# Patient Record
Sex: Male | Born: 1955 | Race: White | Hispanic: No | Marital: Single | Smoking: Never smoker
Health system: Southern US, Community
[De-identification: ages and names within clinical notes are randomized; demographics above are authoritative.]

## PROBLEM LIST (undated history)

## (undated) DIAGNOSIS — F0390 Unspecified dementia without behavioral disturbance: Secondary | ICD-10-CM

## (undated) DIAGNOSIS — E119 Type 2 diabetes mellitus without complications: Secondary | ICD-10-CM

## (undated) DIAGNOSIS — I10 Essential (primary) hypertension: Secondary | ICD-10-CM

---

## 2020-04-29 ENCOUNTER — Emergency Department: Payer: Medicare Other

## 2020-04-29 ENCOUNTER — Observation Stay
Admission: EM | Admit: 2020-04-29 | Discharge: 2020-04-30 | Disposition: A | Discharge status: Home / Self Care | Payer: Medicare Other | Attending: Hospitalist | Admitting: Hospitalist

## 2020-04-29 ENCOUNTER — Other Ambulatory Visit: Payer: Self-pay

## 2020-04-29 DIAGNOSIS — F0390 Unspecified dementia without behavioral disturbance: Secondary | ICD-10-CM | POA: Diagnosis present

## 2020-04-29 DIAGNOSIS — E119 Type 2 diabetes mellitus without complications: Secondary | ICD-10-CM | POA: Diagnosis not present

## 2020-04-29 DIAGNOSIS — I10 Essential (primary) hypertension: Secondary | ICD-10-CM | POA: Insufficient documentation

## 2020-04-29 DIAGNOSIS — R4182 Altered mental status, unspecified: Secondary | ICD-10-CM | POA: Insufficient documentation

## 2020-04-29 DIAGNOSIS — R42 Dizziness and giddiness: Secondary | ICD-10-CM | POA: Diagnosis present

## 2020-04-29 DIAGNOSIS — I160 Hypertensive urgency: Secondary | ICD-10-CM

## 2020-04-29 DIAGNOSIS — Z20822 Contact with and (suspected) exposure to covid-19: Secondary | ICD-10-CM | POA: Diagnosis not present

## 2020-04-29 DIAGNOSIS — T8501XA Breakdown (mechanical) of ventricular intracranial (communicating) shunt, initial encounter: Principal | ICD-10-CM | POA: Insufficient documentation

## 2020-04-29 DIAGNOSIS — T85618A Breakdown (mechanical) of other specified internal prosthetic devices, implants and grafts, initial encounter: Secondary | ICD-10-CM

## 2020-04-29 HISTORY — DX: Unspecified dementia, unspecified severity, without behavioral disturbance, psychotic disturbance, mood disturbance, and anxiety: F03.90

## 2020-04-29 HISTORY — DX: Type 2 diabetes mellitus without complications: E11.9

## 2020-04-29 LAB — CBC WITH DIFFERENTIAL/PLATELET
Abs Immature Granulocytes: 0.02 10*3/uL (ref 0.00–0.07)
Basophils Absolute: 0 10*3/uL (ref 0.0–0.1)
Basophils Relative: 1 %
Eosinophils Absolute: 0.2 10*3/uL (ref 0.0–0.5)
Eosinophils Relative: 3 %
HCT: 39 % (ref 39.0–52.0)
Hemoglobin: 13.3 g/dL (ref 13.0–17.0)
Immature Granulocytes: 0 %
Lymphocytes Relative: 16 %
Lymphs Abs: 1.4 10*3/uL (ref 0.7–4.0)
MCH: 30.7 pg (ref 26.0–34.0)
MCHC: 34.1 g/dL (ref 30.0–36.0)
MCV: 90.1 fL (ref 80.0–100.0)
Monocytes Absolute: 1 10*3/uL (ref 0.1–1.0)
Monocytes Relative: 11 %
Neutro Abs: 6.1 10*3/uL (ref 1.7–7.7)
Neutrophils Relative %: 69 %
Platelets: 233 10*3/uL (ref 150–400)
RBC: 4.33 MIL/uL (ref 4.22–5.81)
RDW: 14.2 % (ref 11.5–15.5)
WBC: 8.8 10*3/uL (ref 4.0–10.5)
nRBC: 0 % (ref 0.0–0.2)

## 2020-04-29 LAB — URINALYSIS, COMPLETE (UACMP) WITH MICROSCOPIC
Bacteria, UA: NONE SEEN
Bilirubin Urine: NEGATIVE
Glucose, UA: NEGATIVE mg/dL
Hgb urine dipstick: NEGATIVE
Ketones, ur: NEGATIVE mg/dL
Leukocytes,Ua: NEGATIVE
Nitrite: NEGATIVE
Protein, ur: NEGATIVE mg/dL
Specific Gravity, Urine: 1.012 (ref 1.005–1.030)
Squamous Epithelial / HPF: NONE SEEN (ref 0–5)
pH: 8 (ref 5.0–8.0)

## 2020-04-29 LAB — ETHANOL: Alcohol, Ethyl (B): <10 mg/dL (ref <10)

## 2020-04-29 LAB — COMPREHENSIVE METABOLIC PANEL
ALT: 22 U/L (ref 0–44)
AST: 17 U/L (ref 15–41)
Albumin: 3.9 g/dL (ref 3.5–5.0)
Alkaline Phosphatase: 90 U/L (ref 38–126)
Anion gap: 8 (ref 5–15)
BUN: 12 mg/dL (ref 8–23)
CO2: 27 mmol/L (ref 22–32)
Calcium: 10 mg/dL (ref 8.9–10.3)
Chloride: 105 mmol/L (ref 98–111)
Creatinine, Ser: 0.88 mg/dL (ref 0.61–1.24)
GFR, Estimated: >60 mL/min (ref >60)
Glucose, Bld: 127 mg/dL — ABNORMAL HIGH (ref 70–99)
Potassium: 3.4 mmol/L — ABNORMAL LOW (ref 3.5–5.1)
Sodium: 140 mmol/L (ref 135–145)
Total Bilirubin: 1.1 mg/dL (ref 0.3–1.2)
Total Protein: 6.9 g/dL (ref 6.5–8.1)

## 2020-04-29 LAB — RESP PANEL BY RT-PCR (FLU A&B, COVID) ARPGX2
Influenza A by PCR: NEGATIVE
Influenza B by PCR: NEGATIVE
SARS Coronavirus 2 by RT PCR: NEGATIVE

## 2020-04-29 LAB — TROPONIN I (HIGH SENSITIVITY): Troponin I (High Sensitivity): 8 ng/L (ref <18)

## 2020-04-29 MED ORDER — SODIUM CHLORIDE 0.9 % IV SOLN: 250.0000 mL | INTRAVENOUS | Status: DC | PRN

## 2020-04-29 MED ORDER — TRAMADOL HCL 50 MG PO TABS
50.0000 mg | ORAL_TABLET | Freq: Four times a day (QID) | ORAL | Status: DC | PRN
  Administered 2020-04-29 – 2020-04-30 (×2): 50 mg via ORAL
  Filled 2020-04-29 (×2): qty 1

## 2020-04-29 MED ORDER — LOSARTAN POTASSIUM 50 MG PO TABS
100.0000 mg | ORAL_TABLET | Freq: Every day | ORAL | Status: DC
  Administered 2020-04-29 – 2020-04-30 (×2): 100 mg via ORAL
  Filled 2020-04-29 (×2): qty 2

## 2020-04-29 MED ORDER — ACETAMINOPHEN 325 MG PO TABS: 650.0000 mg | ORAL_TABLET | Freq: Four times a day (QID) | ORAL | Status: DC | PRN

## 2020-04-29 MED ORDER — DULOXETINE HCL 30 MG PO CPEP
60.0000 mg | ORAL_CAPSULE | Freq: Every day | ORAL | Status: DC
  Administered 2020-04-30: 60 mg via ORAL
  Filled 2020-04-29: qty 2

## 2020-04-29 MED ORDER — ONDANSETRON HCL 4 MG/2ML IJ SOLN: 4.0000 mg | Freq: Four times a day (QID) | INTRAMUSCULAR | Status: DC | PRN

## 2020-04-29 MED ORDER — ONDANSETRON HCL 4 MG PO TABS: 4.0000 mg | ORAL_TABLET | Freq: Four times a day (QID) | ORAL | Status: DC | PRN

## 2020-04-29 MED ORDER — TRAZODONE HCL 50 MG PO TABS
50.0000 mg | ORAL_TABLET | Freq: Every day | ORAL | Status: DC
  Administered 2020-04-30: 50 mg via ORAL
  Filled 2020-04-29: qty 1

## 2020-04-29 MED ORDER — METOPROLOL TARTRATE 50 MG PO TABS
50.0000 mg | ORAL_TABLET | Freq: Two times a day (BID) | ORAL | Status: DC
  Administered 2020-04-30 (×2): 50 mg via ORAL
  Filled 2020-04-29 (×2): qty 1

## 2020-04-29 MED ORDER — FAMOTIDINE 20 MG PO TABS
20.0000 mg | ORAL_TABLET | Freq: Two times a day (BID) | ORAL | Status: DC
  Administered 2020-04-30 (×2): 20 mg via ORAL
  Filled 2020-04-29 (×2): qty 1

## 2020-04-29 MED ORDER — ASPIRIN EC 81 MG PO TBEC
81.0000 mg | DELAYED_RELEASE_TABLET | Freq: Every day | ORAL | Status: DC
  Administered 2020-04-29 – 2020-04-30 (×2): 81 mg via ORAL
  Filled 2020-04-29 (×2): qty 1

## 2020-04-29 MED ORDER — ACETAMINOPHEN 650 MG RE SUPP: 650.0000 mg | Freq: Four times a day (QID) | RECTAL | Status: DC | PRN

## 2020-04-29 MED ORDER — QUETIAPINE FUMARATE 300 MG PO TABS
300.0000 mg | ORAL_TABLET | Freq: Every day | ORAL | Status: DC
  Administered 2020-04-30: 300 mg via ORAL
  Filled 2020-04-29 (×2): qty 1

## 2020-04-29 MED ORDER — ALUM & MAG HYDROXIDE-SIMETH 200-200-20 MG/5ML PO SUSP
30.0000 mL | Freq: Once | ORAL | Status: AC
  Administered 2020-04-29: 30 mL via ORAL
  Filled 2020-04-29: qty 30

## 2020-04-29 MED ORDER — SODIUM CHLORIDE 0.9% FLUSH: 3.0000 mL | INTRAVENOUS | Status: DC | PRN

## 2020-04-29 MED ORDER — SODIUM CHLORIDE 0.9% FLUSH
3.0000 mL | Freq: Two times a day (BID) | INTRAVENOUS | Status: DC
  Administered 2020-04-30 (×2): 3 mL via INTRAVENOUS

## 2020-04-29 MED ORDER — ATORVASTATIN CALCIUM 20 MG PO TABS
80.0000 mg | ORAL_TABLET | Freq: Every day | ORAL | Status: DC
  Administered 2020-04-30: 80 mg via ORAL
  Filled 2020-04-29: qty 4

## 2020-04-29 NOTE — H&P (Signed)
History and Physical    Michael Alvarado EXB:[id number] DOB: 1956-01-03 DOA: 04/29/2020  PCP: Pcp, No   Patient coming from: Home  I have personally briefly reviewed patient's old medical records in [facility]  Chief Complaint: Dizziness                               Headache  Most of the history is obtained from ER notes as patient is a very poor historian  HPI: Michael Alvarado is a 64 y.o. 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  Patient was noted to be markedly hypertensive at the urgent care center so EMS was called to bring patient to the ER.  Patient recently moved to the area from [facility] which is where he had his VP shunt placed.  He moved to [facility] to be with family after the hospital in [place] was unable to place him in a skilled living facility and is currently living temporarily with his mother who is unable to care for him.   He comes in today with complaints of dizziness and a headache which he said has been going on since after the VP shunt was placed.  He denies having any chest pain, no shortness of breath, no palpitations, no diaphoresis, no abdominal pain, no nausea, no vomiting, no changes in his bowel habits, no urinary symptoms, no fever, no chills or cough. Labs show sodium 140, potassium 3.4, chloride 105, bicarb 27, glucose 127, BUN 12, creatinine 0.8, calcium 10, alkaline phosphatase 90, albumin 3.9, AST 17, ALT 22, troponin VIII, white count 8.8, hemoglobin 13.3, hematocrit 39, MCV 90.1, RDW 14.2, platelet count 233 Urinalysis is sterile CT scan of the head without contrast shows no acute intracranial findings.  Right parietal ventriculostomy catheter crosses the midline and terminates in the vicinity of  the margin of the left periventricular white matter.  The mild ventriculomegaly is probably ex vacuo given the lesser degree of prominence of the temporal horns.  Chronic ischemic microvascular white matter disease. Abdominal x-ray shows right posterior approach ventriculoperitoneal shunt with proximal tip overlying the left cerebral hemisphere and distal tip terminating in the right lower abdomen. VP shunt programmed at a pressure setting a little above 146 mm H2O. No kink or discontinuity of the shunt along its course. Please see separately dictated CT head 04/29/2020 for further details. No acute cardiopulmonary abnormality. Nonobstructive bowel gas pattern. Chest x-ray reviewed by me shows no acute abnormality Right hip x-ray shows no acute abnormality Twelve-lead EKG shows sinus rhythm   ED Course: Patient is a 64 year old Caucasian male who was sent from the urgent care center for evaluation of hypertension.  Patient was recently discharged to the care of his sister from the hospital in [facility] because they were unable to place him in a SNF. He moved to [place] and is staying temporarily with his mother.  In the ER he complained of dizziness and a headache as well as right hip pain and had a CT scan of the head which shows a VP shunt.  Patient is oriented only to person and place but not to time and is unable to provide most of the history and the ER provider wanted the patient admitted to  get an MRI of his brain to rule out a stroke.  It is unclear if the VP shunt is MRI compatible and we will wait records from his sister prior to obtaining the MRI.  He will be admitted to the hospital for evaluation   Review of Systems: As per HPI otherwise 10 point review of systems negative.    Past Medical History:  Diagnosis Date  . Dementia (HCC)   . Diabetes mellitus without complication [facility](HCC)     Past Surgical History:  Procedure Laterality Date  . BACK SURGERY       reports that he  has never smoked. He does not have any smokeless tobacco history on file. He reports current alcohol use. He reports previous drug use.  Not on File  Family History  Problem Relation Age of Onset  . Other Mother   . Hypertension Mother      Prior to Admission medications   Not on File    Physical Exam: Vitals:   04/29/20 1430 04/29/20 1515 04/29/20 1630 04/29/20 1654  BP: (!) 172/86  (!) 168/89 (!) 178/111  Pulse: 68 65 68 77  Resp: (!) 22  18 18   Temp:    98 F (36.7 C)  TempSrc:    Oral  SpO2: 100% 100% 100% 96%  Weight:      Height:         Vitals:   04/29/20 1430 04/29/20 1515 04/29/20 1630 04/29/20 1654  BP: (!) 172/86  (!) 168/89 (!) 178/111  Pulse: 68 65 68 77  Resp: (!) 22  18 18   Temp:    98 F (36.7 C)  TempSrc:    Oral  SpO2: 100% 100% 100% 96%  Weight:      Height:        Constitutional: NAD, alert and oriented x 2.  Person and place Eyes: PERRL, lids and conjunctivae normal ENMT: Mucous membranes are moist.  Neck: normal, supple, no masses, no thyromegaly Respiratory: clear to auscultation bilaterally, no wheezing, no crackles. Normal respiratory effort. No accessory muscle use.  Cardiovascular: Regular rate and rhythm, no murmurs / rubs / gallops. No extremity edema. 2+ pedal pulses. No carotid bruits.  Abdomen: no tenderness, no masses palpated. No hepatosplenomegaly. Bowel sounds positive.  Musculoskeletal: no clubbing / cyanosis. No joint deformity upper and lower extremities.  Skin: no rashes, lesions, ulcers.  Scar from recent laminectomy Neurologic: No gross focal neurologic deficit.  Unsteady gait Psychiatric: Normal mood and affect.   Labs on Admission: I have personally reviewed following labs and imaging studies  CBC: Recent Labs  Lab 04/29/20 1328  WBC 8.8  NEUTROABS 6.1  HGB 13.3  HCT 39.0  MCV 90.1  PLT 233   Basic Metabolic Panel: Recent Labs  Lab 04/29/20 1328  NA 140  K 3.4*  CL 105  CO2 27  GLUCOSE 127*  BUN  12  CREATININE 0.88  CALCIUM 10.0   GFR: Estimated Creatinine Clearance: 110.3 mL/min (by C-G formula based on SCr of 0.88 mg/dL). Liver Function Tests: Recent Labs  Lab 04/29/20 1328  AST 17  ALT 22  ALKPHOS 90  BILITOT 1.1  PROT 6.9  ALBUMIN 3.9   No results for input(s): LIPASE, AMYLASE in the last 168 hours. No results for input(s): AMMONIA in the last 168 hours. Coagulation Profile: No results for input(s): INR, PROTIME in the last 168 hours. Cardiac Enzymes: No results for input(s): CKTOTAL, CKMB, CKMBINDEX, TROPONINI in the last 168 hours. BNP (last 3  results) No results for input(s): PROBNP in the last 8760 hours. HbA1C: No results for input(s): HGBA1C in the last 72 hours. CBG: No results for input(s): GLUCAP in the last 168 hours. Lipid Profile: No results for input(s): CHOL, HDL, LDLCALC, TRIG, CHOLHDL, LDLDIRECT in the last 72 hours. Thyroid Function Tests: No results for input(s): TSH, T4TOTAL, FREET4, T3FREE, THYROIDAB in the last 72 hours. Anemia Panel: No results for input(s): VITAMINB12, FOLATE, FERRITIN, TIBC, IRON, RETICCTPCT in the last 72 hours. Urine analysis:    Component Value Date/Time   COLORURINE YELLOW (A) 04/29/2020 1328   APPEARANCEUR HAZY (A) 04/29/2020 1328   LABSPEC 1.012 04/29/2020 1328   PHURINE 8.0 04/29/2020 1328   GLUCOSEU NEGATIVE 04/29/2020 1328   HGBUR NEGATIVE 04/29/2020 1328   BILIRUBINUR NEGATIVE 04/29/2020 1328   KETONESUR NEGATIVE 04/29/2020 1328   PROTEINUR NEGATIVE 04/29/2020 1328   NITRITE NEGATIVE 04/29/2020 1328   LEUKOCYTESUR NEGATIVE 04/29/2020 1328    Radiological Exams on Admission: DG Skull 1-3 Views  Result Date: 04/29/2020 CLINICAL DATA:  Evaluate shunt malfunction.  History of diabetes. EXAM: SKULL - 1-3 VIEW; CHEST 1 VIEW; DG CERVICAL SPINE - 1 VIEW; ABDOMEN - 1 VIEW COMPARISON:  CT head 04/29/2020. FINDINGS: Right posterior approach ventriculoperitoneal shunt with tip terminating over the left cerebral  hemisphere. Programmable ventriculoperitoneal shunt appears to be a Codman Certas valve with a pressure setting a little above 146 mm H2O. The shunt is noted to course along the right neck, right chest, and terminate within the right right lower mid abdomen. No suggestion of kinking or discontinuity of the shunt. Well-aerated sinuses on x-ray skull view. The heart size and mediastinal contours are within normal limits. No focal consolidation. No pulmonary edema. No pleural effusion. No pneumothorax. No acute osseous abnormality. Nonobstructive bowel gas pattern on the abdomen x-ray. No acute osseous abnormality of the visualized osseous structures. Surgical hardware at the L4-L5 level. IMPRESSION: 1. Right posterior approach ventriculoperitoneal shunt with proximal tip overlying the left cerebral hemisphere and distal tip terminating in the right lower abdomen. VP shunt programmed at a pressure setting a little above 146 mm H2O. No kink or discontinuity of the shunt along its course. Please see separately dictated CT head 04/29/2020 for further details. 2. No acute cardiopulmonary abnormality. 3. Nonobstructive bowel gas pattern. Electronically Signed   By: Tish Frederickson M.D.   On: 04/29/2020 15:28   DG Chest 1 View  Result Date: 04/29/2020 CLINICAL DATA:  Evaluate shunt malfunction.  History of diabetes. EXAM: SKULL - 1-3 VIEW; CHEST 1 VIEW; DG CERVICAL SPINE - 1 VIEW; ABDOMEN - 1 VIEW COMPARISON:  CT head 04/29/2020. FINDINGS: Right posterior approach ventriculoperitoneal shunt with tip terminating over the left cerebral hemisphere. Programmable ventriculoperitoneal shunt appears to be a Codman Certas valve with a pressure setting a little above 146 mm H2O. The shunt is noted to course along the right neck, right chest, and terminate within the right right lower mid abdomen. No suggestion of kinking or discontinuity of the shunt. Well-aerated sinuses on x-ray skull view. The heart size and mediastinal  contours are within normal limits. No focal consolidation. No pulmonary edema. No pleural effusion. No pneumothorax. No acute osseous abnormality. Nonobstructive bowel gas pattern on the abdomen x-ray. No acute osseous abnormality of the visualized osseous structures. Surgical hardware at the L4-L5 level. IMPRESSION: 1. Right posterior approach ventriculoperitoneal shunt with proximal tip overlying the left cerebral hemisphere and distal tip terminating in the right lower abdomen. VP shunt programmed at a pressure  setting a little above 146 mm H2O. No kink or discontinuity of the shunt along its course. Please see separately dictated CT head 04/29/2020 for further details. 2. No acute cardiopulmonary abnormality. 3. Nonobstructive bowel gas pattern. Electronically Signed   By: Tish Frederickson M.D.   On: 04/29/2020 15:28   DG Cervical Spine 1 View  Result Date: 04/29/2020 CLINICAL DATA:  Evaluate shunt malfunction.  History of diabetes. EXAM: SKULL - 1-3 VIEW; CHEST 1 VIEW; DG CERVICAL SPINE - 1 VIEW; ABDOMEN - 1 VIEW COMPARISON:  CT head 04/29/2020. FINDINGS: Right posterior approach ventriculoperitoneal shunt with tip terminating over the left cerebral hemisphere. Programmable ventriculoperitoneal shunt appears to be a Codman Certas valve with a pressure setting a little above 146 mm H2O. The shunt is noted to course along the right neck, right chest, and terminate within the right right lower mid abdomen. No suggestion of kinking or discontinuity of the shunt. Well-aerated sinuses on x-ray skull view. The heart size and mediastinal contours are within normal limits. No focal consolidation. No pulmonary edema. No pleural effusion. No pneumothorax. No acute osseous abnormality. Nonobstructive bowel gas pattern on the abdomen x-ray. No acute osseous abnormality of the visualized osseous structures. Surgical hardware at the L4-L5 level. IMPRESSION: 1. Right posterior approach ventriculoperitoneal shunt with  proximal tip overlying the left cerebral hemisphere and distal tip terminating in the right lower abdomen. VP shunt programmed at a pressure setting a little above 146 mm H2O. No kink or discontinuity of the shunt along its course. Please see separately dictated CT head 04/29/2020 for further details. 2. No acute cardiopulmonary abnormality. 3. Nonobstructive bowel gas pattern. Electronically Signed   By: Tish Frederickson M.D.   On: 04/29/2020 15:28   DG Abd 1 View  Result Date: 04/29/2020 CLINICAL DATA:  Evaluate shunt malfunction.  History of diabetes. EXAM: SKULL - 1-3 VIEW; CHEST 1 VIEW; DG CERVICAL SPINE - 1 VIEW; ABDOMEN - 1 VIEW COMPARISON:  CT head 04/29/2020. FINDINGS: Right posterior approach ventriculoperitoneal shunt with tip terminating over the left cerebral hemisphere. Programmable ventriculoperitoneal shunt appears to be a Codman Certas valve with a pressure setting a little above 146 mm H2O. The shunt is noted to course along the right neck, right chest, and terminate within the right right lower mid abdomen. No suggestion of kinking or discontinuity of the shunt. Well-aerated sinuses on x-ray skull view. The heart size and mediastinal contours are within normal limits. No focal consolidation. No pulmonary edema. No pleural effusion. No pneumothorax. No acute osseous abnormality. Nonobstructive bowel gas pattern on the abdomen x-ray. No acute osseous abnormality of the visualized osseous structures. Surgical hardware at the L4-L5 level. IMPRESSION: 1. Right posterior approach ventriculoperitoneal shunt with proximal tip overlying the left cerebral hemisphere and distal tip terminating in the right lower abdomen. VP shunt programmed at a pressure setting a little above 146 mm H2O. No kink or discontinuity of the shunt along its course. Please see separately dictated CT head 04/29/2020 for further details. 2. No acute cardiopulmonary abnormality. 3. Nonobstructive bowel gas pattern. Electronically  Signed   By: Tish Frederickson M.D.   On: 04/29/2020 15:28   CT Head Wo Contrast  Result Date: 04/29/2020 CLINICAL DATA:  Confusion.  Acute neurologic deficit. EXAM: CT HEAD WITHOUT CONTRAST TECHNIQUE: Contiguous axial images were obtained from the base of the skull through the vertex without intravenous contrast. COMPARISON:  None. FINDINGS: Brain: Right parietal ventriculostomy catheter crosses the midline and terminates in the vicinity of the margin of  the left periventricular white matter. The mild ventriculomegaly is probably ex vacuo given the lesser degree of prominence of the temporal horns. 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 Otherwise, the brainstem, cerebellum, cerebral peduncles, thalamus, basal ganglia, basilar cisterns, and ventricular system appear within normal limits. No intracranial hemorrhage, mass lesion, or acute CVA. Vascular: There is atherosclerotic calcification of the cavernous carotid arteries bilaterally. Skull: Unremarkable Sinuses/Orbits: Unremarkable Other: Unremarkable IMPRESSION: 1. No acute intracranial findings. 2. Right parietal ventriculostomy catheter crosses the midline and terminates in the vicinity of the margin of the left periventricular white matter. The mild ventriculomegaly is probably ex vacuo given the lesser degree of prominence of the temporal horns. 3. 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 Electronically Signed   By: Gaylyn Rong M.D.   On: 04/29/2020 14:05   DG Hip Unilat W or Wo Pelvis 2-3 Views Right  Result Date: 04/29/2020 CLINICAL DATA:  Pain in the right hip post fall. EXAM: DG HIP (WITH OR WITHOUT PELVIS) 2-3V RIGHT COMPARISON:  None. FINDINGS: There is no evidence of hip fracture or dislocation. There is no evidence of arthropathy or other focal bone abnormality. Lower  lumbosacral spine fusion. IMPRESSION: Negative. Electronically Signed   By: Ted Mcalpine M.D.   On: 04/29/2020 15:24    EKG: Independently reviewed.  Sinus rhythm  Assessment/Plan Active Problems:   AMS (altered mental status)   Dementia (HCC)   Diabetes mellitus without complication (HCC)   Hypertensive urgency     Altered mental status  Patient is awake, alert and oriented only to person and place Patient states that he was recently diagnosed with dementia but she does not know what his baseline is ??  Concern for possible acute stroke Patient will need an MRI of the brain for further evaluation but has a VP shunt.  Awaiting records from his sister to see if VP shunt is MRI compatible Continue aspirin and statins We will request PT evaluation   Dementia Continue Seroquel, trazodone and Cymbalta   History of normal pressures hydrocephalus status post VP shunt creation The patient was placed in March 2021    Diabetes mellitus Maintain consistent carbohydrate diet Blood sugar checks with meals   Hypertensive urgency Probably related to medication noncompliance Continue metoprolol and Cozaar      DVT prophylaxis: SCD Code Status: Full code Family Communication: Greater than 50% of time was spent discussing patient's condition and plan of care with his sister Zada Girt over the phone.  All questions and concerns have been addressed.  She verbalizes understanding and agrees with the plan. Disposition Plan: Back to previous home environment Consults called: PT    Keirstan Iannello MD [facility]     04/29/2020, 5:13 PM

## 2020-04-29 NOTE — ED Notes (Signed)
Per EDP, number listed as patient's phone number actually belongs to his sister. EDP reports that per sister, pt was homeless in [facility] and his doctor sent him up here to be with his family. Sister now reports that she is unable to care for him at home.   EDP states pt will be either admitted medically or stay as a SW workup.

## 2020-04-29 NOTE — Progress Notes (Signed)
Medication Reconciliation Report  For Home History Technicians  HIGHLIGHTS:  1. The patient WAS personally interviewed 2. If not, what was the main source used: PHARMACY RECORDS 3. Does the patient appear to take any anti-coagulation agents (e.g. warfarin, Eliquis or Xarelto): NO 4. Does the patient appear to take any anti-convulsant agents (e.g. divalproex, levetiracetam or phenytoin): NO 5. Does the patient appear to use any insulin products (e.g. Lantus, Novolin or Humalog): NO 6. Does the patient appear to take any "beta-blockers" (e.g. metoprolol, carvedilol or bisoprolol: YES  BARRIERS:  1. Were there any barriers that prevented or complicated the medication reconciliation process: YES 2. If yes, what was the primary barrier encountered: Other 3. Does the patient appear compliant with prescribed medications: NO 4. Does the patient express any barriers with compliance: YES 5. What is the primary barrier the patient reports: Other non-compliance   NOTES:[Include any concerns, remarks or complaints the patient expresses regarding medication therapy. Any observations or other information that might be useful to the treatment team can also be included. Immediate needs or concerns should be referred to the RN or appropriate member of the treatment team.]  [employer]  ** The above is intended solely for informational and/or communicative purposes. It should in no way be considered an endorsement of any specific treatment, therapy or action. **

## 2020-04-29 NOTE — ED Notes (Signed)
Pt reports that [facility] he had the shunt placed

## 2020-04-29 NOTE — ED Notes (Signed)
Pt reports that Bonita Quin, his sister who dropped him off at [facility], went to [employer]. He does not know her phone number, or the number of anyone else we could contact on his behalf. Pt lives at home by himself.   Pt gives verbal permission to update Bonita Quin on care and let her in for visitation if she arrives.   Pt unable to confirm home address. Pt does have some dizziness at baseline. Pt states he had a "stent place in my head" back in March (frequent falls and dizziness), closed incision noted on URQ abdomen from drain placement. 8/10 headache.  Pt denies chest pain, SOB, n/v/d, abd pain, vision changes.

## 2020-04-29 NOTE — ED Triage Notes (Signed)
Pt arrives from [facility] pain via [facility]. Hx dizziness, but less than normal. back surgery done in march. Pt sister dropped him off, but was not present for EMS.   EMS reports mild confusion by pt, though alert and oriented x4  Pt on arrival denies cp, negative stroke screen. Pt denies being on blood thinners, does report BP meds (does not know which).   212/110 99% 84 hr

## 2020-04-29 NOTE — ED Notes (Signed)
EDP states he wants an MRI done to confirm shunt functionality. Unknown at this time if shunt is MRI compatible, pt sister is on her way and will hopefully be able to assist in identification.   Per EDP, if shunt is not compatible it will need to be turned off for the MRI

## 2020-04-29 NOTE — ED Notes (Signed)
UA sent to lab with save label

## 2020-04-29 NOTE — ED Notes (Signed)
Pt reports heartburn, per EDP once xrays clear orders will be placed. Pt notified

## 2020-04-29 NOTE — ED Notes (Signed)
Received callback @1734  from pt sister ). Information obtained recorded below.  Sister believes shunt was placed March 21st. She is able to confirm from billing documents only that is a ventriculoperitoneal shunt, also listed as brain fluid drainage shunt, but she was unable to locate an exact device name or model. She reports that she is having difficulty accessing his online medical record at this time.   She reports that Dr. March 23 and a Dr. Dennis Bast were listed several times on the documents, but is unable to verify if they are the ones who performed the procedure.   She believes the procedure took place at either [facility] or [facility].   She reported that his case worker in [facility] was [place]. She does not have her phone number, but will try to retrieve it tomorrow. She did provide an email address, which is  [email or web address]  For medical history, she knows that he "had a stroke 15-20 years ago" and "has a history of some heart issues," but is not able to elaborate further.   This RN gave her the ED fax number per the secretary, and Cala Bradford stated that she would send over a medication list and relevant medical documents if she found anything else out.   Nothing received at the time of writing

## 2020-04-29 NOTE — ED Provider Notes (Signed)
[facility] Emergency Department Provider Note   ____________________________________________   First MD Initiated Contact with Patient 04/29/20 1328     (approximate)  I have reviewed the triage vital signs and the nursing notes.   HISTORY  Chief Complaint Hypertension    HPI Michael Alvarado is a 64 y.o. male with past medical history of hypertension, diabetes, and VP shunt who presents to the ED for hypertension and dizziness.  History is limited due to patient's confusion and possible history of dementia.  Per EMS, patient was dropped off at urgent care by his sister due to concern for right hip pain and dizziness.  Patient was noted to be markedly hypertensive at urgent care and so EMS was called to bring patient to the ED.  Patient currently complains of feeling dizzy, states this has been the case ever since he got to surgery in March.  He states he had back surgery as well as a "stent" to his brain, is now currently visiting family here in [place].  Speaking with his sister, patient was previously homeless in the [facility] area, where he had his VP shunt placed.  During his most recent hospital admission, plan was made for patient to be discharged to family here at [facility].  Sister now states they are unable to care for the patient and he has been increasingly confused and dizzy lately.  He also complained of right hip pain, and so she brought him to urgent care earlier today.        Past Medical History:  Diagnosis Date  . Diabetes mellitus without complication [facility])     Patient Active Problem List   Diagnosis Date Noted  . AMS (altered mental status) 04/29/2020    History reviewed. No pertinent surgical history.  Prior to Admission medications   Not on File    Allergies Patient has no allergy information on record.  History reviewed. No pertinent family history.  Social History Social History   Tobacco Use  . Smoking status: Not  on file  Substance Use Topics  . Alcohol use: Not on file  . Drug use: Not on file    Review of Systems  Constitutional: No fever/chills Eyes: No visual changes. ENT: No sore throat. Cardiovascular: Denies chest pain. Respiratory: Denies shortness of breath. Gastrointestinal: No abdominal pain.  No nausea, no vomiting.  No diarrhea.  No constipation. Genitourinary: Negative for dysuria. Musculoskeletal: Negative for back pain.  Positive for right hip pain. Skin: Negative for rash. Neurological: Negative for headaches, focal weakness or numbness.  Positive for dizziness.  ____________________________________________   PHYSICAL EXAM:  VITAL SIGNS: ED Triage Vitals  Enc Vitals Group     BP 04/29/20 1323 (!) 194/106     Pulse Rate 04/29/20 1323 71     Resp 04/29/20 1323 16     Temp --      Temp src --      SpO2 04/29/20 1323 100 %     Weight 04/29/20 1324 271 lb 2.7 oz (123 kg)     Height 04/29/20 1324 5\' 11"  (1.803 m)     Head Circumference --      Peak Flow --      Pain Score 04/29/20 1324 8     Pain Loc --      Pain Edu? --      Excl. in GC? --     Constitutional: Alert and oriented to person and place, but not time. Eyes: Conjunctivae are normal.  Pupils equal round and reactive to light bilaterally. Head: Atraumatic. Nose: No congestion/rhinnorhea. Mouth/Throat: Mucous membranes are moist. Neck: Normal ROM Cardiovascular: Normal rate, regular rhythm. Grossly normal heart sounds. Respiratory: Normal respiratory effort.  No retractions. Lungs CTAB. Gastrointestinal: Soft and nontender. No distention. Genitourinary: deferred Musculoskeletal: No lower extremity tenderness nor edema. Neurologic:  Normal speech and language. No gross focal neurologic deficits are appreciated.  Ambulates with unsteady gait. Skin:  Skin is warm, dry and intact. No rash noted. Psychiatric: Mood and affect are normal. Speech and behavior are  normal.  ____________________________________________   LABS (all labs ordered are listed, but only abnormal results are displayed)  Labs Reviewed  COMPREHENSIVE METABOLIC PANEL - Abnormal; Notable for the following components:      Result Value   Potassium 3.4 (*)    Glucose, Bld 127 (*)    All other components within normal limits  URINALYSIS, COMPLETE (UACMP) WITH MICROSCOPIC - Abnormal; Notable for the following components:   Color, Urine YELLOW (*)    APPearance HAZY (*)    All other components within normal limits  RESP PANEL BY RT-PCR (FLU A&B, COVID) ARPGX2  CBC WITH DIFFERENTIAL/PLATELET  ETHANOL  TROPONIN I (HIGH SENSITIVITY)   ____________________________________________  EKG  ED ECG REPORT I, Chesley Noon, the attending physician, personally viewed and interpreted this ECG.   Date: 04/29/2020  EKG Time: 13:24  Rate: 69  Rhythm: normal sinus rhythm  Axis: Normal  Intervals:none  ST&T Change: None   PROCEDURES  Procedure(s) performed (including Critical Care):  Procedures   ____________________________________________   INITIAL IMPRESSION / ASSESSMENT AND PLAN / ED COURSE       64 year old male with past medical history of hypertension, diabetes, and VP shunt who presents to the ED due to right hip pain along with increasing confusion and dizziness.  Patient disoriented to time and walks with an unsteady gait, but otherwise has no focal neurologic deficits on exam.  No apparent injuries to his right hip, chest x-ray reviewed by me and is negative for fracture or other acute process.  He is neurovascularly intact to his bilateral lower extremities.  CT head performed and shows distal tip of VP shunt to be in the periventricular white matter, no obstructions noted on shunt series.  Case discussed with Dr. 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  Currently no  concerns whatsoever for infected shunt.  Remainder of lab work is unremarkable.  UA shows no evidence of infection.  His baseline mental status is unclear, but given concerns for increasing confusion as well as unsteady gait, case discussed with hospitalist for admission.  He would benefit from MRI to further rule out stroke, although we will need to determine whether his shunt valve is MRI compatible.  I have asked his sister to assist with this and also help obtaining his usual medication list.      ____________________________________________   FINAL CLINICAL IMPRESSION(S) / ED DIAGNOSES  Final diagnoses:  Shunt malfunction  Altered mental status, unspecified altered mental status type     ED Discharge Orders    None       Note:  This document was prepared using Dragon voice recognition software and may include unintentional dictation errors.   Chesley Noon, MD 04/29/20 [phone]

## 2020-04-29 NOTE — ED Notes (Signed)
Pt given phone to call Bonita Quin, number dialed with no response. Pt says he left a message and hung up. Unsure if that is actually what occurred  Pt denies that he is homeless in [facility], states he was living with his mother, Lucendia Herrlich. Pt does affirm that his doctor recommended he come up here for the holidays to stay with Bonita Quin

## 2020-04-29 NOTE — ED Notes (Signed)
Pt states [name] is his sister, would like [facility] called to see if she came back. Will address at earliest opportunity   Pt reports that he had surgery on his lumbar spine around the same time the shunt was placed. Pt reports pain to sometimes both hips, but was being seen at [facility] for pain to the left hip. Pt reports pain "comes and goes."  Attempted to ambulate pt per MD. Pt able to stand and walk without assistance, though does state that he gets mildly dizzy and parts of the room sometimes spin. Pt somewhat confused during assessment.   Pt states that the surgeries were done in [facility], where he lives, but he is here visiting his sister for the holidays. Pt had planned to stay through christmas, states that his doctor had told him to follow up with [facility] while he was here.

## 2020-04-29 NOTE — ED Notes (Signed)
Pt given phone and [facility] number to call and inquire about his sister

## 2020-04-30 ENCOUNTER — Encounter: Payer: Self-pay | Admitting: Internal Medicine

## 2020-04-30 DIAGNOSIS — I160 Hypertensive urgency: Secondary | ICD-10-CM | POA: Diagnosis not present

## 2020-04-30 DIAGNOSIS — T8501XA Breakdown (mechanical) of ventricular intracranial (communicating) shunt, initial encounter: Secondary | ICD-10-CM | POA: Diagnosis not present

## 2020-04-30 LAB — CBC
HCT: 36.9 % — ABNORMAL LOW (ref 39.0–52.0)
Hemoglobin: 12.6 g/dL — ABNORMAL LOW (ref 13.0–17.0)
MCH: 30.9 pg (ref 26.0–34.0)
MCHC: 34.1 g/dL (ref 30.0–36.0)
MCV: 90.4 fL (ref 80.0–100.0)
Platelets: 211 10*3/uL (ref 150–400)
RBC: 4.08 MIL/uL — ABNORMAL LOW (ref 4.22–5.81)
RDW: 14.3 % (ref 11.5–15.5)
WBC: 6.6 10*3/uL (ref 4.0–10.5)
nRBC: 0 % (ref 0.0–0.2)

## 2020-04-30 LAB — BASIC METABOLIC PANEL
Anion gap: 9 (ref 5–15)
BUN: 13 mg/dL (ref 8–23)
CO2: 24 mmol/L (ref 22–32)
Calcium: 9.7 mg/dL (ref 8.9–10.3)
Chloride: 106 mmol/L (ref 98–111)
Creatinine, Ser: 0.83 mg/dL (ref 0.61–1.24)
GFR, Estimated: >60 mL/min (ref >60)
Glucose, Bld: 111 mg/dL — ABNORMAL HIGH (ref 70–99)
Potassium: 3.5 mmol/L (ref 3.5–5.1)
Sodium: 139 mmol/L (ref 135–145)

## 2020-04-30 LAB — HIV ANTIBODY (ROUTINE TESTING W REFLEX): HIV Screen 4th Generation wRfx: NONREACTIVE

## 2020-04-30 MED ORDER — METOPROLOL TARTRATE 50 MG PO TABS: 50.0000 mg | ORAL_TABLET | Freq: Two times a day (BID) | ORAL | 2 refills | Status: DC

## 2020-04-30 MED ORDER — BUTALBITAL-APAP-CAFFEINE 50-325-40 MG PO TABS: 1.0000 | ORAL_TABLET | Freq: Two times a day (BID) | ORAL | 0 refills | Status: AC | PRN

## 2020-04-30 MED ORDER — AMLODIPINE BESYLATE 10 MG PO TABS: 10.0000 mg | ORAL_TABLET | Freq: Every day | ORAL | 2 refills | Status: DC

## 2020-04-30 MED ORDER — PNEUMOCOCCAL VAC POLYVALENT 25 MCG/0.5ML IJ INJ: 0.5000 mL | INJECTION | Freq: Once | INTRAMUSCULAR | 0 refills | Status: AC

## 2020-04-30 MED ORDER — BUTALBITAL-APAP-CAFFEINE 50-325-40 MG PO TABS
1.0000 | ORAL_TABLET | Freq: Once | ORAL | Status: AC
  Administered 2020-04-30: 13:00:00 1 via ORAL
  Filled 2020-04-30: qty 1

## 2020-04-30 MED ORDER — LOSARTAN POTASSIUM 100 MG PO TABS: 100.0000 mg | ORAL_TABLET | Freq: Every day | ORAL | 2 refills | Status: DC

## 2020-04-30 MED ORDER — PNEUMOCOCCAL VAC POLYVALENT 25 MCG/0.5ML IJ INJ: 0.5000 mL | INJECTION | INTRAMUSCULAR | Status: DC

## 2020-04-30 MED ORDER — AMLODIPINE BESYLATE 10 MG PO TABS
10.0000 mg | ORAL_TABLET | Freq: Every day | ORAL | Status: DC
  Administered 2020-04-30: 13:00:00 10 mg via ORAL
  Filled 2020-04-30: qty 1

## 2020-04-30 NOTE — Plan of Care (Signed)
DISCHARGE NOTE HOME Lyall Faciane to be discharged home per MD order. Discussed prescriptions and follow up appointments with the patient. Prescriptions given to patient; medication list explained in detail. Patient verbalized understanding.  Skin clean, dry and intact without evidence of skin break down, no evidence of skin tears noted. IV catheter discontinued intact. Site without signs and symptoms of complications. Dressing and pressure applied. Pt denies pain at the site currently. No complaints noted.  Patient free of lines, drains, and wounds.   An After Visit Summary (AVS) was printed and given to the patient. Patient escorted via wheelchair, and discharged home via private auto.  Arlice Colt, RN

## 2020-04-30 NOTE — Consult Note (Signed)
Neurosurgery-New Consultation Evaluation 04/30/2020 Michael Alvarado [id number]  Identifying Statement: Michael Alvarado is a 64 y.o. male from [place] [address] with prior VP shunt placement  Physician Requesting Consultation: [facility] ED  History of Present Illness: Mr Michael Alvarado had a VP shunt placed in March of this year for symptoms he describes as difficulty walking with headaches and dizziness.  He does state they did a lumbar puncture at that time and he felt he did have some improvement.  After the shunt was placed, he does feel his symptoms did improve but more recently is dealing with more difficulty with headaches and dizziness.  He states his gait remains improved but still not completely normal.  He currently is complaining of severe headache.  He denies any problems with the shunt placement and recovery from the surgery.   Past Medical History:  Past Medical History:  Diagnosis Date   Dementia (HCC)    Diabetes mellitus without complication (HCC)     Social History: Social History   Socioeconomic History   Marital status: Single    Spouse name: Not on file   Number of children: Not on file   Years of education: Not on file   Highest education level: Not on file  Occupational History   Not on file  Tobacco Use   Smoking status: Never Smoker   Smokeless tobacco: Never Used  Substance and Sexual Activity   Alcohol use: Yes    Comment: occasional   Drug use: Not Currently   Sexual activity: Not on file  Other Topics Concern   Not on file  Social History Narrative   Not on file   Social Determinants of Health   Financial Resource Strain:    Difficulty of Paying Living Expenses: Not on file  Food Insecurity:    Worried About Running Out of Food in the Last Year: Not on file   Ran Out of Food in the Last Year: Not on file  Transportation Needs:    Lack of Transportation (Medical): Not on file   Lack of Transportation (Non-Medical): Not on file  Physical  Activity:    Days of Exercise per Week: Not on file   Minutes of Exercise per Session: Not on file  Stress:    Feeling of Stress : Not on file  Social Connections:    Frequency of Communication with Friends and Family: Not on file   Frequency of Social Gatherings with Friends and Family: Not on file   Attends Religious Services: Not on file   Active Member of Clubs or Organizations: Not on file   Attends Banker Meetings: Not on file   Marital Status: Not on file  Intimate Partner Violence:    Fear of Current or Ex-Partner: Not on file   Emotionally Abused: Not on file   Physically Abused: Not on file   Sexually Abused: Not on file    Family History: Family History  Problem Relation Age of Onset   Other Mother    Hypertension Mother     Review of Systems:  Review of Systems - General ROS: Positive for dizziness Psychological ROS: Negative Ophthalmic ROS: Negative ENT ROS: Negative Hematological and Lymphatic ROS: Negative  Endocrine ROS: Negative Respiratory ROS: Negative Cardiovascular ROS: Negative Gastrointestinal ROS: Negative Genito-Urinary ROS: Negative Musculoskeletal ROS: Negative Neurological ROS: Positive for headaches, gait difficulty Dermatological ROS: Negative  Physical Exam: BP (!) 180/72 (BP Location: Right Arm)   Pulse 82   Temp 98.3 F (36.8 C)  Resp 17   Ht 5\' 11"  (1.803 m)   Wt 98.8 kg   SpO2 98%   BMI 30.38 kg/m  Body mass index is 30.38 kg/m. Body surface area is 2.22 meters squared. General appearance: Alert, cooperative, in no acute distress Head: Normocephalic, healed incision in right parietal area Eyes: Normal, EOM intact Oropharynx: Moist without lesions Ext: No edema in LE bilaterally  Neurologic exam:  Mental status: alertness: alert, orientation: Oriented to person and city, did not know month affect: normal Speech: fluent and clear, naming and repetition intact Cranial nerves:  II: Visual fields are full by  confrontation with exception of some slight decrease in the right lower quadrant,  No ptosis III/IV/VI: extra-ocular motions intact bilaterally V/VII:no evidence of facial droop or weakness  VIII: hearing normal XI: trapezius strength symmetric,  sternocleidomastoid strength symmetric XII: tongue strength symmetric  Motor:strength symmetric 5/5, normal muscle mass and tone in all extremities with slight right pronator drift Sensory: intact to light touch in all extremities Gait: not tested  Laboratory: Results for orders placed or performed during the hospital encounter of 04/29/20  Resp Panel by RT-PCR (Flu A&B, Covid) Nasopharyngeal Swab   Specimen: Nasopharyngeal Swab; Nasopharyngeal(NP) swabs in vial transport medium  Result Value Ref Range   SARS Coronavirus 2 by RT PCR NEGATIVE NEGATIVE   Influenza A by PCR NEGATIVE NEGATIVE   Influenza B by PCR NEGATIVE NEGATIVE  CBC with Differential  Result Value Ref Range   WBC 8.8 4.0 - 10.5 K/uL   RBC 4.33 4.22 - 5.81 MIL/uL   Hemoglobin 13.3 13.0 - 17.0 g/dL   HCT 05/01/20 39 - 52 %   MCV 90.1 80.0 - 100.0 fL   MCH 30.7 26.0 - 34.0 pg   MCHC 34.1 30.0 - 36.0 g/dL   RDW 30.0 76.2 - 26.3 %   Platelets 233 150 - 400 K/uL   nRBC 0.0 0.0 - 0.2 %   Neutrophils Relative % 69 %   Neutro Abs 6.1 1.7 - 7.7 K/uL   Lymphocytes Relative 16 %   Lymphs Abs 1.4 0.7 - 4.0 K/uL   Monocytes Relative 11 %   Monocytes Absolute 1.0 0.1 - 1.0 K/uL   Eosinophils Relative 3 %   Eosinophils Absolute 0.2 0.0 - 0.5 K/uL   Basophils Relative 1 %   Basophils Absolute 0.0 0.0 - 0.1 K/uL   Immature Granulocytes 0 %   Abs Immature Granulocytes 0.02 0.00 - 0.07 K/uL  Comprehensive metabolic panel  Result Value Ref Range   Sodium 140 135 - 145 mmol/L   Potassium 3.4 (L) 3.5 - 5.1 mmol/L   Chloride 105 98 - 111 mmol/L   CO2 27 22 - 32 mmol/L   Glucose, Bld 127 (H) 70 - 99 mg/dL   BUN 12 8 - 23 mg/dL   Creatinine, Ser 33.5 0.61 - 1.24 mg/dL   Calcium 4.56 8.9  - 25.6 mg/dL   Total Protein 6.9 6.5 - 8.1 g/dL   Albumin 3.9 3.5 - 5.0 g/dL   AST 17 15 - 41 U/L   ALT 22 0 - 44 U/L   Alkaline Phosphatase 90 38 - 126 U/L   Total Bilirubin 1.1 0.3 - 1.2 mg/dL   GFR, Estimated 38.9 >37 mL/min   Anion gap 8 5 - 15  Ethanol  Result Value Ref Range   Alcohol, Ethyl (B) <10 <10 mg/dL  Urinalysis, Complete w Microscopic  Result Value Ref Range   Color, Urine YELLOW (A) YELLOW  APPearance HAZY (A) CLEAR   Specific Gravity, Urine 1.012 1.005 - 1.030   pH 8.0 5.0 - 8.0   Glucose, UA NEGATIVE NEGATIVE mg/dL   Hgb urine dipstick NEGATIVE NEGATIVE   Bilirubin Urine NEGATIVE NEGATIVE   Ketones, ur NEGATIVE NEGATIVE mg/dL   Protein, ur NEGATIVE NEGATIVE mg/dL   Nitrite NEGATIVE NEGATIVE   Leukocytes,Ua NEGATIVE NEGATIVE   RBC / HPF 0-5 0 - 5 RBC/hpf   WBC, UA 0-5 0 - 5 WBC/hpf   Bacteria, UA NONE SEEN NONE SEEN   Squamous Epithelial / LPF NONE SEEN 0 - 5   Mucus PRESENT    Amorphous Crystal PRESENT   Basic metabolic panel  Result Value Ref Range   Sodium 139 135 - 145 mmol/L   Potassium 3.5 3.5 - 5.1 mmol/L   Chloride 106 98 - 111 mmol/L   CO2 24 22 - 32 mmol/L   Glucose, Bld 111 (H) 70 - 99 mg/dL   BUN 13 8 - 23 mg/dL   Creatinine, Ser 4.58 0.61 - 1.24 mg/dL   Calcium 9.7 8.9 - 09.9 mg/dL   GFR, Estimated >83 >38 mL/min   Anion gap 9 5 - 15  CBC  Result Value Ref Range   WBC 6.6 4.0 - 10.5 K/uL   RBC 4.08 (L) 4.22 - 5.81 MIL/uL   Hemoglobin 12.6 (L) 13.0 - 17.0 g/dL   HCT 25.0 (L) 39 - 52 %   MCV 90.4 80.0 - 100.0 fL   MCH 30.9 26.0 - 34.0 pg   MCHC 34.1 30.0 - 36.0 g/dL   RDW 53.9 76.7 - 34.1 %   Platelets 211 150 - 400 K/uL   nRBC 0.0 0.0 - 0.2 %  Troponin I (High Sensitivity)  Result Value Ref Range   Troponin I (High Sensitivity) 8 <18 ng/L   I personally reviewed labs  Imaging: CT Head: 1. No acute intracranial findings. 2. Right parietal ventriculostomy catheter crosses the midline and terminates in the vicinity of the  margin of the left periventricular white matter. The mild ventriculomegaly is probably ex vacuo given the lesser degree of prominence of the temporal horns. 3. 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  Shunt series: 1. Right posterior approach ventriculoperitoneal shunt with proximal tip overlying the left cerebral hemisphere and distal tip terminating in the right lower abdomen. VP shunt programmed at a pressure setting a little above 146 mm H2O. No kink or discontinuity of the shunt along its course. Please see separately dictated CT head 04/29/2020 for further details.   Impression/Plan:  Mr Berkovich is here for evaluation of headaches and dizziness after prior VP shunt placement.  We did discuss that the shunt typically is placed for gait difficulty but does not always treat headaches and dizziness.  I am concerned he may have some other etiology that needs further evaluation by neurology.  Given the well-healed incisions, the imaging findings, and his symptoms, I do not recommend any surgical intervention for the shunt.   1.  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  Plan - No acute intervention, can follow up as outpatient in neurosurgery clinic for further evaluation

## 2020-04-30 NOTE — Discharge Summary (Signed)
Physician Discharge Summary   Michael Alvarado  male DOB: [birth year]  WGN:[id number]  PCP: Danella Penton, MD  Admit date: 04/29/2020 Discharge date: 04/30/2020  Admitted From: home Disposition:  home Home Health: Yes CODE STATUS: Full code  Discharge Instructions    Discharge instructions   Complete by: As directed    Headache and dizziness can be cause by elevated blood pressure.  You are prescribed an additional blood pressure medication Amlodipine.  Please take that as directed along with your home Losartan and Lopressor. [facility] Course:  For full details, please see H&P, progress notes, consult notes and ancillary notes.  Briefly,  Michael Alvarado is a 64 y.o. 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  Patient was noted to be markedly hypertensive at the urgent care center so EMS was called to bring patient to the ER.   Patient recently moved to the area from [facility] which is where he had his VP shunt placed.  He moved to [facility] to be with family after the hospital in [place] was unable to place him in a skilled living facility and is currently living temporarily with his mother.  Headache and dizziness Pt reported headache is a chronic issue for which tylenol does not work.  Pt was ordered Fioricet.  PT worked with patient and recommended HHPT.  Altered mental status, ruled out  Patient was awake, alert and oriented to person and place, and able to converse and respond to questions and commands, although exhibited annoyance and seemed deliberate in giving unclear answers.  Unclear what pt's baseline cognitive function was, however, the fact that pt used intentional sarcasm in some of his answers suggests  that pt has decent mental capacity.  Pt had no focal neurological deficit to warrant MRI brain for stroke workup.  Hypertensive urgency Probably related to medication noncompliance.  BP 194/106 on presentation.  Pt continued on home metoprolol and Cozaar, and new amlodipine 10 mg daily added.  Dementia Continued Seroquel, trazodone and Cymbalta  History of normal pressures hydrocephalus status post VP shunt creation in March 2021 Neurosurgery was consulted and recommended no acute intervention.  Pt will follow up as outpatient.  Hx of Diabetes mellitus Not on antiglycemics at home.  Outpatient followup.    Discharge Diagnoses:  Active Problems:   AMS (altered mental status)   Dementia (HCC)   Diabetes mellitus without complication (HCC)   Hypertensive urgency    Discharge Instructions:  Allergies as of 04/30/2020   Not on File     Medication List    TAKE these medications   amLODipine 10 MG tablet Commonly known as: NORVASC Take 1 tablet (10 mg total) by mouth daily.   atorvastatin 80 MG tablet Commonly known as: LIPITOR Take 80 mg by mouth at bedtime.   butalbital-acetaminophen-caffeine 50-325-40 MG tablet Commonly known as: FIORICET Take 1 tablet by mouth 2 (two) times daily as needed for headache.   DULoxetine 60 MG capsule Commonly known as: CYMBALTA Take 60 mg by mouth daily.   famotidine 20 MG tablet Commonly known as: PEPCID Take 20 mg by mouth 2 (two) times daily.   losartan 100 MG tablet Commonly known as: COZAAR Take 1 tablet (100 mg total) by mouth daily.  metoprolol tartrate 50 MG tablet Commonly known as: LOPRESSOR Take 1 tablet (50 mg total) by mouth 2 (two) times daily.   pneumococcal 23 valent vaccine 25 MCG/0.5ML injection Commonly known as: PNEUMOVAX-23 Inject 0.5 mLs into the muscle once for 1 dose.   QUEtiapine 300 MG tablet Commonly known as: SEROQUEL Take 300 mg by mouth at bedtime.   traMADol 50 MG tablet Commonly  known as: ULTRAM Take 50 mg by mouth every 6 (six) hours as needed for moderate pain.   traZODone 50 MG tablet Commonly known as: DESYREL Take 50 mg by mouth at bedtime.        Follow-up Information    Danella PentonMiller, Mark F, MD. Schedule an appointment as soon as possible for a visit in 1 week(s).   Specialty: Internal Medicine Contact information: [phone] [facility] [address] [facility] [place] [address] [phone]        Lucy Chrisook, Steven, MD. Schedule an appointment as soon as possible for a visit in 2 week(s).   Specialty: Neurosurgery Contact information: [address]  [place] [id number] [phone]               Not on File   The results of significant diagnostics from this hospitalization (including imaging, microbiology, ancillary and laboratory) are listed below for reference.   Consultations:   Procedures/Studies: DG Skull 1-3 Views  Result Date: 04/29/2020 CLINICAL DATA:  Evaluate shunt malfunction.  History of diabetes. EXAM: SKULL - 1-3 VIEW; CHEST 1 VIEW; DG CERVICAL SPINE - 1 VIEW; ABDOMEN - 1 VIEW COMPARISON:  CT head 04/29/2020. FINDINGS: Right posterior approach ventriculoperitoneal shunt with tip terminating over the left cerebral hemisphere. Programmable ventriculoperitoneal shunt appears to be a Codman Certas valve with a pressure setting a little above 146 mm H2O. The shunt is noted to course along the right neck, right chest, and terminate within the right right lower mid abdomen. No suggestion of kinking or discontinuity of the shunt. Well-aerated sinuses on x-ray skull view. The heart size and mediastinal contours are within normal limits. No focal consolidation. No pulmonary edema. No pleural effusion. No pneumothorax. No acute osseous abnormality. Nonobstructive bowel gas pattern on the abdomen x-ray. No acute osseous abnormality of the visualized osseous structures. Surgical hardware at the L4-L5 level. IMPRESSION: 1.  Right posterior approach ventriculoperitoneal shunt with proximal tip overlying the left cerebral hemisphere and distal tip terminating in the right lower abdomen. VP shunt programmed at a pressure setting a little above 146 mm H2O. No kink or discontinuity of the shunt along its course. Please see separately dictated CT head 04/29/2020 for further details. 2. No acute cardiopulmonary abnormality. 3. Nonobstructive bowel gas pattern. Electronically Signed   By: Tish FredericksonMorgane  Naveau M.D.   On: 04/29/2020 15:28   DG Chest 1 View  Result Date: 04/29/2020 CLINICAL DATA:  Evaluate shunt malfunction.  History of diabetes. EXAM: SKULL - 1-3 VIEW; CHEST 1 VIEW; DG CERVICAL SPINE - 1 VIEW; ABDOMEN - 1 VIEW COMPARISON:  CT head 04/29/2020. FINDINGS: Right posterior approach ventriculoperitoneal shunt with tip terminating over the left cerebral hemisphere. Programmable ventriculoperitoneal shunt appears to be a Codman Certas valve with a pressure setting a little above 146 mm H2O. The shunt is noted to course along the right neck, right chest, and terminate within the right right lower mid abdomen. No suggestion of kinking or discontinuity of the shunt. Well-aerated sinuses on x-ray skull view. The heart size and mediastinal contours are within normal limits. No focal consolidation. No  pulmonary edema. No pleural effusion. No pneumothorax. No acute osseous abnormality. Nonobstructive bowel gas pattern on the abdomen x-ray. No acute osseous abnormality of the visualized osseous structures. Surgical hardware at the L4-L5 level. IMPRESSION: 1. Right posterior approach ventriculoperitoneal shunt with proximal tip overlying the left cerebral hemisphere and distal tip terminating in the right lower abdomen. VP shunt programmed at a pressure setting a little above 146 mm H2O. No kink or discontinuity of the shunt along its course. Please see separately dictated CT head 04/29/2020 for further details. 2. No acute cardiopulmonary  abnormality. 3. Nonobstructive bowel gas pattern. Electronically Signed   By: Tish Frederickson M.D.   On: 04/29/2020 15:28   DG Cervical Spine 1 View  Result Date: 04/29/2020 CLINICAL DATA:  Evaluate shunt malfunction.  History of diabetes. EXAM: SKULL - 1-3 VIEW; CHEST 1 VIEW; DG CERVICAL SPINE - 1 VIEW; ABDOMEN - 1 VIEW COMPARISON:  CT head 04/29/2020. FINDINGS: Right posterior approach ventriculoperitoneal shunt with tip terminating over the left cerebral hemisphere. Programmable ventriculoperitoneal shunt appears to be a Codman Certas valve with a pressure setting a little above 146 mm H2O. The shunt is noted to course along the right neck, right chest, and terminate within the right right lower mid abdomen. No suggestion of kinking or discontinuity of the shunt. Well-aerated sinuses on x-ray skull view. The heart size and mediastinal contours are within normal limits. No focal consolidation. No pulmonary edema. No pleural effusion. No pneumothorax. No acute osseous abnormality. Nonobstructive bowel gas pattern on the abdomen x-ray. No acute osseous abnormality of the visualized osseous structures. Surgical hardware at the L4-L5 level. IMPRESSION: 1. Right posterior approach ventriculoperitoneal shunt with proximal tip overlying the left cerebral hemisphere and distal tip terminating in the right lower abdomen. VP shunt programmed at a pressure setting a little above 146 mm H2O. No kink or discontinuity of the shunt along its course. Please see separately dictated CT head 04/29/2020 for further details. 2. No acute cardiopulmonary abnormality. 3. Nonobstructive bowel gas pattern. Electronically Signed   By: Tish Frederickson M.D.   On: 04/29/2020 15:28   DG Abd 1 View  Result Date: 04/29/2020 CLINICAL DATA:  Evaluate shunt malfunction.  History of diabetes. EXAM: SKULL - 1-3 VIEW; CHEST 1 VIEW; DG CERVICAL SPINE - 1 VIEW; ABDOMEN - 1 VIEW COMPARISON:  CT head 04/29/2020. FINDINGS: Right posterior approach  ventriculoperitoneal shunt with tip terminating over the left cerebral hemisphere. Programmable ventriculoperitoneal shunt appears to be a Codman Certas valve with a pressure setting a little above 146 mm H2O. The shunt is noted to course along the right neck, right chest, and terminate within the right right lower mid abdomen. No suggestion of kinking or discontinuity of the shunt. Well-aerated sinuses on x-ray skull view. The heart size and mediastinal contours are within normal limits. No focal consolidation. No pulmonary edema. No pleural effusion. No pneumothorax. No acute osseous abnormality. Nonobstructive bowel gas pattern on the abdomen x-ray. No acute osseous abnormality of the visualized osseous structures. Surgical hardware at the L4-L5 level. IMPRESSION: 1. Right posterior approach ventriculoperitoneal shunt with proximal tip overlying the left cerebral hemisphere and distal tip terminating in the right lower abdomen. VP shunt programmed at a pressure setting a little above 146 mm H2O. No kink or discontinuity of the shunt along its course. Please see separately dictated CT head 04/29/2020 for further details. 2. No acute cardiopulmonary abnormality. 3. Nonobstructive bowel gas pattern. Electronically Signed   By: Tish Frederickson M.D.   On:  04/29/2020 15:28   CT Head Wo Contrast  Result Date: 04/29/2020 CLINICAL DATA:  Confusion.  Acute neurologic deficit. EXAM: CT HEAD WITHOUT CONTRAST TECHNIQUE: Contiguous axial images were obtained from the base of the skull through the vertex without intravenous contrast. COMPARISON:  None. FINDINGS: Brain: Right parietal ventriculostomy catheter crosses the midline and terminates in the vicinity of the margin of the left periventricular white matter. The mild ventriculomegaly is probably ex vacuo given the lesser degree of prominence of the temporal horns. Periventricular white matter and corona radiata hypodensities favor chronic ischemic microvascular white  matter disease. Otherwise, the brainstem, cerebellum, cerebral peduncles, thalamus, basal ganglia, basilar cisterns, and ventricular system appear within normal limits. No intracranial hemorrhage, mass lesion, or acute CVA. Vascular: There is atherosclerotic calcification of the cavernous carotid arteries bilaterally. Skull: Unremarkable Sinuses/Orbits: Unremarkable Other: Unremarkable IMPRESSION: 1. No acute intracranial findings. 2. Right parietal ventriculostomy catheter crosses the midline and terminates in the vicinity of the margin of the left periventricular white matter. The mild ventriculomegaly is probably ex vacuo given the lesser degree of prominence of the temporal horns. 3. 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 Electronically Signed   By: Gaylyn Rong M.D.   On: 04/29/2020 14:05   DG Hip Unilat W or Wo Pelvis 2-3 Views Right  Result Date: 04/29/2020 CLINICAL DATA:  Pain in the right hip post fall. EXAM: DG HIP (WITH OR WITHOUT PELVIS) 2-3V RIGHT COMPARISON:  None. FINDINGS: There is no evidence of hip fracture or dislocation. There is no evidence of arthropathy or other focal bone abnormality. Lower lumbosacral spine fusion. IMPRESSION: Negative. Electronically Signed   By: Ted Mcalpine M.D.   On: 04/29/2020 15:24      Labs: BNP (last 3 results) No results for input(s): BNP in the last 8760 hours. Basic Metabolic Panel: Recent Labs  Lab 04/29/20 1328 04/30/20 0520  NA 140 139  K 3.4* 3.5  CL 105 106  CO2 27 24  GLUCOSE 127* 111*  BUN 12 13  CREATININE 0.88 0.83  CALCIUM 10.0 9.7   Liver Function Tests: Recent Labs  Lab 04/29/20 1328  AST 17  ALT 22  ALKPHOS 90  BILITOT 1.1  PROT 6.9  ALBUMIN 3.9   No results for input(s): LIPASE, AMYLASE in the last 168 hours. No results for input(s): AMMONIA in the last 168 hours. CBC: Recent Labs  Lab 04/29/20 1328 04/30/20 0520   WBC 8.8 6.6  NEUTROABS 6.1  --   HGB 13.3 12.6*  HCT 39.0 36.9*  MCV 90.1 90.4  PLT 233 211   Cardiac Enzymes: No results for input(s): CKTOTAL, CKMB, CKMBINDEX, TROPONINI in the last 168 hours. BNP: Invalid input(s): POCBNP CBG: No results for input(s): GLUCAP in the last 168 hours. D-Dimer No results for input(s): DDIMER in the last 72 hours. Hgb A1c No results for input(s): HGBA1C in the last 72 hours. Lipid Profile No results for input(s): CHOL, HDL, LDLCALC, TRIG, CHOLHDL, LDLDIRECT in the last 72 hours. Thyroid function studies No results for input(s): TSH, T4TOTAL, T3FREE, THYROIDAB in the last 72 hours.  Invalid input(s): FREET3 Anemia work up No results for input(s): VITAMINB12, FOLATE, FERRITIN, TIBC, IRON, RETICCTPCT in the last 72 hours. Urinalysis    Component Value Date/Time   COLORURINE YELLOW (A) 04/29/2020 1328   APPEARANCEUR HAZY (A) 04/29/2020 1328   LABSPEC 1.012 04/29/2020 1328   PHURINE 8.0 04/29/2020 1328   GLUCOSEU NEGATIVE 04/29/2020 1328   HGBUR  NEGATIVE 04/29/2020 1328   BILIRUBINUR NEGATIVE 04/29/2020 1328   KETONESUR NEGATIVE 04/29/2020 1328   PROTEINUR NEGATIVE 04/29/2020 1328   NITRITE NEGATIVE 04/29/2020 1328   LEUKOCYTESUR NEGATIVE 04/29/2020 1328   Sepsis Labs Invalid input(s): PROCALCITONIN,  WBC,  LACTICIDVEN Microbiology Recent Results (from the past 240 hour(s))  Resp Panel by RT-PCR (Flu A&B, Covid) Nasopharyngeal Swab     Status: None   Collection Time: 04/29/20  3:30 PM   Specimen: Nasopharyngeal Swab; Nasopharyngeal(NP) swabs in vial transport medium  Result Value Ref Range Status   SARS Coronavirus 2 by RT PCR NEGATIVE NEGATIVE Final    Comment: (NOTE) SARS-CoV-2 target nucleic acids are NOT DETECTED.  The SARS-CoV-2 RNA is generally detectable in upper respiratory specimens during the acute phase of infection. The lowest concentration of SARS-CoV-2 viral copies this assay can detect is 138 copies/mL. A negative  result does not preclude SARS-Cov-2 infection and should not be used as the sole basis for treatment or other patient management decisions. A negative result may occur with  improper specimen collection/handling, submission of specimen other than nasopharyngeal swab, presence of viral mutation(s) within the areas targeted by this assay, and inadequate number of viral copies(<138 copies/mL). A negative result must be combined with clinical observations, patient history, and epidemiological information. The expected result is Negative.  Fact Sheet for Patients:  BloggerCourse.com  Fact Sheet for Healthcare Providers:  SeriousBroker.it  This test is no t yet approved or cleared by the [place] FDA and  has been authorized for detection and/or diagnosis of SARS-CoV-2 by FDA under an Emergency Use Authorization (EUA). This EUA will remain  in effect (meaning this test can be used) for the duration of the COVID-19 declaration under Section 564(b)(1) of the Act, 21 U.S.C.section 360bbb-3(b)(1), unless the authorization is terminated  or revoked sooner.       Influenza A by PCR NEGATIVE NEGATIVE Final   Influenza B by PCR NEGATIVE NEGATIVE Final    Comment: (NOTE) The Xpert Xpress SARS-CoV-2/FLU/RSV plus assay is intended as an aid in the diagnosis of influenza from Nasopharyngeal swab specimens and should not be used as a sole basis for treatment. Nasal washings and aspirates are unacceptable for Xpert Xpress SARS-CoV-2/FLU/RSV testing.  Fact Sheet for Patients: BloggerCourse.com  Fact Sheet for Healthcare Providers: SeriousBroker.it  This test is not yet approved or cleared by the [place] FDA and has been authorized for detection and/or diagnosis of SARS-CoV-2 by FDA under an Emergency Use Authorization (EUA). This EUA will remain in effect (meaning this test can be used)  for the duration of the COVID-19 declaration under Section 564(b)(1) of the Act, 21 U.S.C. section 360bbb-3(b)(1), unless the authorization is terminated or revoked.  Performed at [facility], [address] [place], [place] [address]      Total time spend on discharging this patient, including the last patient exam, discussing the hospital stay, instructions for ongoing care as it relates to all pertinent caregivers, as well as preparing the medical discharge records, prescriptions, and/or referrals as applicable, is 45 minutes.    Darlin Priestly, MD  [facility] 04/30/2020, 11:54 AM

## 2020-04-30 NOTE — Care Management Obs Status (Signed)
MEDICARE OBSERVATION STATUS NOTIFICATION   Patient Details  Name: Michael Alvarado MRN: [id number] Date of Birth: Oct 19, 1955   Medicare Observation Status Notification Given:  Yes    Allayne Butcher, RN 04/30/2020, 12:10 PM

## 2020-04-30 NOTE — Care Management CC44 (Signed)
Condition Code 44 Documentation Completed  Patient Details  Name: Michael Alvarado MRN: [id number] Date of Birth: 1955/09/25   Condition Code 44 given:  Yes Patient signature on Condition Code 44 notice:  Yes Documentation of 2 MD's agreement:  Yes Code 44 added to claim:  Yes    Allayne Butcher, RN 04/30/2020, 12:10 PM

## 2020-04-30 NOTE — TOC Initial Note (Signed)
Transition of Care [facility]) - Initial/Assessment Note    Patient Details  Name: Michael Alvarado MRN: [id number] Date of Birth: 07/18/1955  Transition of Care [facility]) CM/SW Contact:    Michael Hutching, RN Phone Number: 04/30/2020, 1:40 PM  Clinical Narrative:                 Patient admitted to the hospital with altered mental status.  Patient is now medically stable for discharge home.  Neurosurgery recommends outpatient follow up.   RNCM met with patient at the bedside, patient is unable to answer most questions, he does not know how he got to the hospital or his mother's address and telephone number.  Patient reports that he is up here from [facility] and hopes to be going back after the holidays.    Patient's mother and sister arrived and RNCM was able to speak with them.  Sister, Michael Alvarado, reports that she brought the patient up to [facility] from [facility] about a month ago.  Patient was homeless in [facility] and the hospital there contacted the sister.  Patient is currently living with his mother, Michael Alvarado, but she is having trouble caring for the patient.  Michael Alvarado would like for home health services to be set up, RNCM will try to find an agency to accept for RN, PT, and SW.  Family hopes to have help to get patient placed in ALF.  Per family patient has an appointment with Dr. Sabra Heck next Monday.  Sister is working to get patient an ID since he has lost his.    [facility] has agreed to accept patient for RN, PT, and SW.  [facility] will reach out to patient's sister to schedule visit.    Expected Discharge Plan: [place] Barriers to Discharge: Barriers Resolved   Patient Goals and CMS Choice Patient states their goals for this hospitalization and ongoing recovery are:: Unable to voice goals CMS Medicare.gov Compare Post Acute Care list provided to:: Patient Represenative (must comment) (sister) Choice offered to / list presented to : Sibling (sister Michael Alvarado)  Expected Discharge  Plan and Services Expected Discharge Plan: Michael Alvarado   Discharge Planning Services: CM Consult Post Acute Care Choice: Home Health   Expected Discharge Date: 04/30/20               DME Arranged: N/A DME Agency: NA       HH Arranged: RN, PT, Social Work [place] Agency: [place] ([place]) Date [place]: 04/30/20 Time [place]: 1338 Representative spoke with at [place]: Michael Alvarado Arrangements/Services   Lives with:: Parents Patient language and need for interpreter reviewed:: Yes Do you feel safe going back to the place where you live?: Yes      Need for Family Participation in Patient Care: Yes (Comment) (cognitive decline) Care giver support system in place?: Yes (comment) (sister and mother) Current home services: DME (walker) Criminal Activity/Legal Involvement Pertinent to Current Situation/Hospitalization: No - Comment as needed  Activities of Daily Living Home Assistive Devices/Equipment: None ADL Screening (condition at time of admission) Patient's cognitive ability adequate to safely complete daily activities?: Yes Is the patient deaf or have difficulty hearing?: No Does the patient have difficulty seeing, even when wearing glasses/contacts?: No Does the patient have difficulty concentrating, remembering, or making decisions?: No Patient able to express need for assistance with ADLs?: No Does the patient have difficulty dressing or bathing?: No Independently performs ADLs?: Yes (appropriate  for developmental age) Does the patient have difficulty walking or climbing stairs?: No Weakness of Legs: None Weakness of Arms/Hands: None  Permission Sought/Granted Permission sought to share information with : Case Manager, Family Supports, Other (comment) Permission granted to share information with : Yes, Verbal Permission Granted  Share Information with NAME: Michael Alvarado and [place] granted to share info w  AGENCY: Advanced  Permission granted to share info w Relationship: sister and mom     Emotional Assessment Appearance:: Appears older than stated age Attitude/Demeanor/Rapport: Avoidant, Inconsistent Affect (typically observed): Frustrated Orientation: : Oriented to Self, Oriented to Situation, Oriented to Place Alcohol / Substance Use: Not Applicable Psych Involvement: No (comment)  Admission diagnosis:  Shunt malfunction [T85.618A] Altered mental status, unspecified altered mental status type [R41.82] AMS (altered mental status) [R41.82] Patient Active Problem List   Diagnosis Date Noted  . AMS (altered mental status) 04/29/2020  . Dementia ([place])   . Diabetes mellitus without complication ([place])   . Hypertensive urgency    PCP:  Michael Aus, MD Pharmacy:   [id number] [place]: [phone]: [phone]    Social Determinants of Health (SDOH) Interventions    Readmission Risk Interventions No flowsheet data found.

## 2020-04-30 NOTE — Evaluation (Signed)
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 Pt not oriented to year nor month. Pt with c/o dizziness with standing but negative orthostatic vital signs (see below). Pt requires CGA for gait without AD and pt attempts stair negotiation but requires mod assist with L rail before PT defers activity 2/2 pt's impaired balance & decreased safety awareness & receptiveness to education re: safety with task. Pt demonstrates significant R inattention throughout session, unable to locate hallway to R side & unable to locate door to stairwell located directly in front of him as pt instead attempts to go in door on his L side. Pt also demonstrates impaired gait pattern & use of RUE/RLE during gait that appears as if pt has R hemiparesis but BUE strength equal, BLE AROM equal. Pt does decline gait with RW. Will continue to follow pt acutely to focus on balance, safety with mobility, gait & stair negotiation. Recommending f/u HHPT upon d/c.  Sitting EOB: BP = 158/92 mmHg (LUE) Standing EOB at 0: BP = 156/96 mmHg (LUE) Standing EOB at 2: BP = 154/95 mmHg (LUE)    Follow Up Recommendations Home health PT;Supervision for mobility/OOB;Supervision/Assistance - 24 hour    Equipment Recommendations  Rolling walker with 5" wheels (pt declines using AD during session but would benefit from AD for increased balance during mobility)    Recommendations for Other Services       Precautions / Restrictions Precautions Precautions: Fall Precaution Comments: R inattention Restrictions Weight Bearing  Restrictions: No      Mobility  Bed Mobility Overal bed mobility: Modified Independent Bed Mobility: Supine to Sit;Sit to Supine     Supine to sit: Modified independent (Device/Increase time);HOB elevated Sit to supine: Modified independent (Device/Increase time);HOB elevated     Patient Response: Impulsive  Transfers Overall transfer level: Needs assistance   Transfers: Sit to/from Stand Sit to Stand: Supervision         General transfer comment: none  Ambulation/Gait Ambulation/Gait assistance: Min guard Gait Distance (Feet): 250 Feet Assistive device: None   Gait velocity: decreased   General Gait Details: decreased R hip flexion during swing phase, minimal to no R heel strike & decreased dorsiflexion during swing phase, minimal RUE reciprocal arm swing  Stairs Stairs: Yes Stairs assistance: Mod assist Stair Management: One rail Left (descending) Number of Stairs: 2 General stair comments: Pt attempt to descend stairs with LUE support & step over step pattern but pt demonstrates impaired balance & PT deferred further stair negotiation after 2 steps  Wheelchair Mobility    Modified Rankin (Stroke Patients Only)       Balance Overall balance assessment: Mild deficits observed, not formally tested;Needs assistance           Standing balance-Leahy Scale: Fair Standing balance comment: static standing without UE support with supervision, CGA<>min assist for gait without AD                             Pertinent Vitals/Pain Pain Assessment: Faces Faces Pain Scale: Hurts even more Pain Location: L knee during  gait - standing rest break provided & pt continues ambulating back to room, pt als notes unrated back pain (unable to report if this is acute or chronic) & pain meds requested from nurse Pain Intervention(s): Limited activity within patient's tolerance;Monitored during session;Patient requesting pain meds-RN notified    Home Living  Family/patient expects to be discharged to:: Private residence Living Arrangements: Parent Available Help at Discharge: Family Type of Home: House Home Access: Stairs to enter Entrance Stairs-Rails: Right;Left;Can reach both Secretary/administrator of Steps: 4 Home Layout: One level        Prior Function Level of Independence: Independent         Comments: when asked about falls in the past 6 months pt states "I'm sure I have but I don't remember"     Hand Dominance   Dominant Hand: Left    Extremity/Trunk Assessment   Upper Extremity Assessment Upper Extremity Assessment:  (BUE strength & AROM grossly equal & WFL)    Lower Extremity Assessment Lower Extremity Assessment:  (not formally tested, BLE appears equal)       Communication   Communication: No difficulties  Cognition Arousal/Alertness: Awake/alert Behavior During Therapy: Impulsive (irritated with PT) Overall Cognitive Status: No family/caregiver present to determine baseline cognitive functioning                                 General Comments: Pt not very open to education, pt not oriented to year (reports it's "52" & that it's "Christmas" when asked about current month), pt irrirated when PT attempts to provide CGA during gait      General Comments General comments (skin integrity, edema, etc.): Pt not open to education, reports "y'all think I'm crazy"    Exercises     Assessment/Plan    PT Assessment Patient needs continued PT services  PT Problem List Decreased mobility;Decreased safety awareness;Decreased balance;Decreased knowledge of use of DME;Decreased cognition       PT Treatment Interventions      PT Goals (Current goals can be found in the Care Plan section)  Acute Rehab PT Goals Patient Stated Goal: to go home PT Goal Formulation: With patient Time For Goal Achievement: 05/14/20 Potential to Achieve Goals: Fair    Frequency Min 2X/week   Barriers to discharge  Decreased caregiver support unsure if family can provide assistance at d/c    Co-evaluation               AM-PAC PT "6 Clicks" Mobility  Outcome Measure Help needed turning from your back to your side while in a flat bed without using bedrails?: None Help needed moving from lying on your back to sitting on the side of a flat bed without using bedrails?: None Help needed moving to and from a bed to a chair (including a wheelchair)?: A Little Help needed standing up from a chair using your arms (e.g., wheelchair or bedside chair)?: None Help needed to walk in hospital room?: A Little Help needed climbing 3-5 steps with a railing? : Total 6 Click Score: 19    End of Session Equipment Utilized During Treatment: Gait belt Activity Tolerance: Patient tolerated treatment well Patient left: in bed;with call bell/phone within reach;with bed alarm set Nurse Communication: Mobility status (R inattention) PT Visit Diagnosis: Unsteadiness on feet (R26.81);Muscle weakness (generalized) (M62.81);Difficulty in walking, not elsewhere classified (R26.2)    Time: 1020-1040 PT Time Calculation (min) (ACUTE ONLY): 20 min  Charges:   PT Evaluation $PT Eval Moderate Complexity: 1 Mod PT Treatments $Therapeutic Activity: 8-22 mins        Aleda Grana, PT, DPT 04/30/20, 11:00 AM   Sandi Mariscal 04/30/2020, 10:55 AM

## 2020-07-11 DIAGNOSIS — G912 (Idiopathic) normal pressure hydrocephalus: Secondary | ICD-10-CM | POA: Diagnosis not present

## 2020-07-11 DIAGNOSIS — I519 Heart disease, unspecified: Secondary | ICD-10-CM | POA: Diagnosis not present

## 2020-07-11 DIAGNOSIS — M5416 Radiculopathy, lumbar region: Secondary | ICD-10-CM | POA: Diagnosis not present

## 2020-07-27 ENCOUNTER — Other Ambulatory Visit: Payer: Self-pay | Admitting: Physical Medicine & Rehabilitation

## 2020-07-27 ENCOUNTER — Other Ambulatory Visit (HOSPITAL_COMMUNITY): Payer: Self-pay | Admitting: Physical Medicine & Rehabilitation

## 2020-07-27 DIAGNOSIS — M5442 Lumbago with sciatica, left side: Secondary | ICD-10-CM | POA: Diagnosis not present

## 2020-07-27 DIAGNOSIS — G8929 Other chronic pain: Secondary | ICD-10-CM | POA: Diagnosis not present

## 2020-07-27 DIAGNOSIS — M5416 Radiculopathy, lumbar region: Secondary | ICD-10-CM

## 2020-08-08 ENCOUNTER — Other Ambulatory Visit: Payer: Self-pay

## 2020-08-08 ENCOUNTER — Ambulatory Visit
Admission: RE | Admit: 2020-08-08 | Discharge: 2020-08-08 | Disposition: A | Discharge status: Home / Self Care | Payer: Medicare HMO | Source: Ambulatory Visit | Attending: Physical Medicine & Rehabilitation | Admitting: Physical Medicine & Rehabilitation

## 2020-08-08 DIAGNOSIS — M5416 Radiculopathy, lumbar region: Secondary | ICD-10-CM | POA: Diagnosis not present

## 2020-08-08 DIAGNOSIS — M545 Low back pain, unspecified: Secondary | ICD-10-CM | POA: Diagnosis not present

## 2020-08-13 DIAGNOSIS — M2560 Stiffness of unspecified joint, not elsewhere classified: Secondary | ICD-10-CM | POA: Diagnosis not present

## 2020-08-13 DIAGNOSIS — M5442 Lumbago with sciatica, left side: Secondary | ICD-10-CM | POA: Diagnosis not present

## 2020-08-13 DIAGNOSIS — M6281 Muscle weakness (generalized): Secondary | ICD-10-CM | POA: Diagnosis not present

## 2020-08-13 DIAGNOSIS — G8929 Other chronic pain: Secondary | ICD-10-CM | POA: Diagnosis not present

## 2020-08-14 DIAGNOSIS — I10 Essential (primary) hypertension: Secondary | ICD-10-CM | POA: Diagnosis not present

## 2020-08-14 DIAGNOSIS — Z982 Presence of cerebrospinal fluid drainage device: Secondary | ICD-10-CM | POA: Diagnosis not present

## 2020-08-15 DIAGNOSIS — G8929 Other chronic pain: Secondary | ICD-10-CM | POA: Diagnosis not present

## 2020-08-15 DIAGNOSIS — M5442 Lumbago with sciatica, left side: Secondary | ICD-10-CM | POA: Diagnosis not present

## 2020-08-15 DIAGNOSIS — M48062 Spinal stenosis, lumbar region with neurogenic claudication: Secondary | ICD-10-CM | POA: Diagnosis not present

## 2020-08-16 DIAGNOSIS — F0391 Unspecified dementia with behavioral disturbance: Secondary | ICD-10-CM | POA: Diagnosis not present

## 2020-08-16 DIAGNOSIS — I10 Essential (primary) hypertension: Secondary | ICD-10-CM | POA: Diagnosis not present

## 2020-08-16 DIAGNOSIS — F319 Bipolar disorder, unspecified: Secondary | ICD-10-CM | POA: Diagnosis not present

## 2020-08-16 DIAGNOSIS — G912 (Idiopathic) normal pressure hydrocephalus: Secondary | ICD-10-CM | POA: Diagnosis not present

## 2020-08-16 DIAGNOSIS — G8929 Other chronic pain: Secondary | ICD-10-CM | POA: Diagnosis not present

## 2020-08-16 DIAGNOSIS — M5116 Intervertebral disc disorders with radiculopathy, lumbar region: Secondary | ICD-10-CM | POA: Diagnosis not present

## 2020-08-16 DIAGNOSIS — E1169 Type 2 diabetes mellitus with other specified complication: Secondary | ICD-10-CM | POA: Diagnosis not present

## 2020-08-16 DIAGNOSIS — E7849 Other hyperlipidemia: Secondary | ICD-10-CM | POA: Diagnosis not present

## 2020-08-16 DIAGNOSIS — M5432 Sciatica, left side: Secondary | ICD-10-CM | POA: Diagnosis not present

## 2020-08-17 DIAGNOSIS — E7849 Other hyperlipidemia: Secondary | ICD-10-CM | POA: Diagnosis not present

## 2020-08-17 DIAGNOSIS — F319 Bipolar disorder, unspecified: Secondary | ICD-10-CM | POA: Diagnosis not present

## 2020-08-17 DIAGNOSIS — I10 Essential (primary) hypertension: Secondary | ICD-10-CM | POA: Diagnosis not present

## 2020-08-17 DIAGNOSIS — M5432 Sciatica, left side: Secondary | ICD-10-CM | POA: Diagnosis not present

## 2020-08-17 DIAGNOSIS — G912 (Idiopathic) normal pressure hydrocephalus: Secondary | ICD-10-CM | POA: Diagnosis not present

## 2020-08-17 DIAGNOSIS — G8929 Other chronic pain: Secondary | ICD-10-CM | POA: Diagnosis not present

## 2020-08-17 DIAGNOSIS — E1169 Type 2 diabetes mellitus with other specified complication: Secondary | ICD-10-CM | POA: Diagnosis not present

## 2020-08-17 DIAGNOSIS — M5116 Intervertebral disc disorders with radiculopathy, lumbar region: Secondary | ICD-10-CM | POA: Diagnosis not present

## 2020-08-17 DIAGNOSIS — F0391 Unspecified dementia with behavioral disturbance: Secondary | ICD-10-CM | POA: Diagnosis not present

## 2020-08-21 DIAGNOSIS — M5116 Intervertebral disc disorders with radiculopathy, lumbar region: Secondary | ICD-10-CM | POA: Diagnosis not present

## 2020-08-21 DIAGNOSIS — G8929 Other chronic pain: Secondary | ICD-10-CM | POA: Diagnosis not present

## 2020-08-21 DIAGNOSIS — M5432 Sciatica, left side: Secondary | ICD-10-CM | POA: Diagnosis not present

## 2020-08-21 DIAGNOSIS — I10 Essential (primary) hypertension: Secondary | ICD-10-CM | POA: Diagnosis not present

## 2020-08-21 DIAGNOSIS — E7849 Other hyperlipidemia: Secondary | ICD-10-CM | POA: Diagnosis not present

## 2020-08-21 DIAGNOSIS — F0391 Unspecified dementia with behavioral disturbance: Secondary | ICD-10-CM | POA: Diagnosis not present

## 2020-08-21 DIAGNOSIS — G912 (Idiopathic) normal pressure hydrocephalus: Secondary | ICD-10-CM | POA: Diagnosis not present

## 2020-08-21 DIAGNOSIS — F319 Bipolar disorder, unspecified: Secondary | ICD-10-CM | POA: Diagnosis not present

## 2020-08-21 DIAGNOSIS — E1169 Type 2 diabetes mellitus with other specified complication: Secondary | ICD-10-CM | POA: Diagnosis not present

## 2020-08-22 DIAGNOSIS — E7849 Other hyperlipidemia: Secondary | ICD-10-CM | POA: Diagnosis not present

## 2020-08-22 DIAGNOSIS — G912 (Idiopathic) normal pressure hydrocephalus: Secondary | ICD-10-CM | POA: Diagnosis not present

## 2020-08-22 DIAGNOSIS — F0391 Unspecified dementia with behavioral disturbance: Secondary | ICD-10-CM | POA: Diagnosis not present

## 2020-08-22 DIAGNOSIS — M5432 Sciatica, left side: Secondary | ICD-10-CM | POA: Diagnosis not present

## 2020-08-22 DIAGNOSIS — G8929 Other chronic pain: Secondary | ICD-10-CM | POA: Diagnosis not present

## 2020-08-22 DIAGNOSIS — M5116 Intervertebral disc disorders with radiculopathy, lumbar region: Secondary | ICD-10-CM | POA: Diagnosis not present

## 2020-08-22 DIAGNOSIS — E1169 Type 2 diabetes mellitus with other specified complication: Secondary | ICD-10-CM | POA: Diagnosis not present

## 2020-08-27 DIAGNOSIS — I10 Essential (primary) hypertension: Secondary | ICD-10-CM | POA: Diagnosis not present

## 2020-08-27 DIAGNOSIS — F319 Bipolar disorder, unspecified: Secondary | ICD-10-CM | POA: Diagnosis not present

## 2020-08-27 DIAGNOSIS — E7849 Other hyperlipidemia: Secondary | ICD-10-CM | POA: Diagnosis not present

## 2020-08-27 DIAGNOSIS — G912 (Idiopathic) normal pressure hydrocephalus: Secondary | ICD-10-CM | POA: Diagnosis not present

## 2020-08-27 DIAGNOSIS — E1169 Type 2 diabetes mellitus with other specified complication: Secondary | ICD-10-CM | POA: Diagnosis not present

## 2020-08-27 DIAGNOSIS — M5432 Sciatica, left side: Secondary | ICD-10-CM | POA: Diagnosis not present

## 2020-08-27 DIAGNOSIS — M5116 Intervertebral disc disorders with radiculopathy, lumbar region: Secondary | ICD-10-CM | POA: Diagnosis not present

## 2020-08-27 DIAGNOSIS — G8929 Other chronic pain: Secondary | ICD-10-CM | POA: Diagnosis not present

## 2020-08-27 DIAGNOSIS — F0391 Unspecified dementia with behavioral disturbance: Secondary | ICD-10-CM | POA: Diagnosis not present

## 2020-09-05 DIAGNOSIS — E1169 Type 2 diabetes mellitus with other specified complication: Secondary | ICD-10-CM | POA: Diagnosis not present

## 2020-09-05 DIAGNOSIS — M48062 Spinal stenosis, lumbar region with neurogenic claudication: Secondary | ICD-10-CM | POA: Diagnosis not present

## 2020-09-05 DIAGNOSIS — E785 Hyperlipidemia, unspecified: Secondary | ICD-10-CM | POA: Diagnosis not present

## 2020-09-05 DIAGNOSIS — M5442 Lumbago with sciatica, left side: Secondary | ICD-10-CM | POA: Diagnosis not present

## 2020-09-05 DIAGNOSIS — G8929 Other chronic pain: Secondary | ICD-10-CM | POA: Diagnosis not present

## 2020-09-06 ENCOUNTER — Other Ambulatory Visit: Payer: Self-pay

## 2020-09-06 DIAGNOSIS — G912 (Idiopathic) normal pressure hydrocephalus: Secondary | ICD-10-CM | POA: Diagnosis not present

## 2020-09-06 DIAGNOSIS — F0391 Unspecified dementia with behavioral disturbance: Secondary | ICD-10-CM | POA: Diagnosis not present

## 2020-09-06 DIAGNOSIS — G8929 Other chronic pain: Secondary | ICD-10-CM | POA: Diagnosis not present

## 2020-09-06 DIAGNOSIS — F319 Bipolar disorder, unspecified: Secondary | ICD-10-CM | POA: Diagnosis not present

## 2020-09-06 DIAGNOSIS — E7849 Other hyperlipidemia: Secondary | ICD-10-CM | POA: Diagnosis not present

## 2020-09-06 DIAGNOSIS — E1169 Type 2 diabetes mellitus with other specified complication: Secondary | ICD-10-CM | POA: Diagnosis not present

## 2020-09-06 DIAGNOSIS — M5432 Sciatica, left side: Secondary | ICD-10-CM | POA: Diagnosis not present

## 2020-09-06 DIAGNOSIS — M5116 Intervertebral disc disorders with radiculopathy, lumbar region: Secondary | ICD-10-CM | POA: Diagnosis not present

## 2020-09-06 DIAGNOSIS — I10 Essential (primary) hypertension: Secondary | ICD-10-CM | POA: Diagnosis not present

## 2020-09-06 NOTE — Patient Outreach (Addendum)
[facility]) Care Management  09/06/2020  Trevaughn Schear 10/11/1955 [id number]   Referral Date: 09/06/20 Referral Source: [facility] Nurse line Referral Reason: Needs help with placement   Outreach Attempt: No answer. HIPAA compliant voice message left.  Incoming call from sister Bonita Quin. She gave patient's cell number.    Telephone call to patient for follow up. Patient acknowledges that he did call the nurse line about placement. However, he states that home health nurse is present and put her on the phone. Spoke with nurse Phillips Hay from [facility]. She states they have been involved with patient for a while and that he is having some pain and not sleeping well.  Explained to her that patient called the nurse line and wants placement to a facility.  She states that their social worker has been involved and that she will call to get them involved again to help with placement.  Advised to her that sounds like a plan and that CM would follow up with Mr. Sawin again in the next two weeks. She verbalized understanding and advised patient of my planned follow up.    Plan: RN CM will follow up with patient again within the next two weeks.    Bary Leriche, RN, MSN [facility] Care Management Care Management Coordinator Direct Line [phone] Toll Free: [phone]  Fax: [phone]

## 2020-09-10 DIAGNOSIS — G912 (Idiopathic) normal pressure hydrocephalus: Secondary | ICD-10-CM | POA: Diagnosis not present

## 2020-09-10 DIAGNOSIS — I10 Essential (primary) hypertension: Secondary | ICD-10-CM | POA: Diagnosis not present

## 2020-09-10 DIAGNOSIS — E1169 Type 2 diabetes mellitus with other specified complication: Secondary | ICD-10-CM | POA: Diagnosis not present

## 2020-09-10 DIAGNOSIS — F319 Bipolar disorder, unspecified: Secondary | ICD-10-CM | POA: Diagnosis not present

## 2020-09-10 DIAGNOSIS — F0391 Unspecified dementia with behavioral disturbance: Secondary | ICD-10-CM | POA: Diagnosis not present

## 2020-09-10 DIAGNOSIS — E7849 Other hyperlipidemia: Secondary | ICD-10-CM | POA: Diagnosis not present

## 2020-09-10 DIAGNOSIS — G8929 Other chronic pain: Secondary | ICD-10-CM | POA: Diagnosis not present

## 2020-09-10 DIAGNOSIS — M5116 Intervertebral disc disorders with radiculopathy, lumbar region: Secondary | ICD-10-CM | POA: Diagnosis not present

## 2020-09-10 DIAGNOSIS — M5432 Sciatica, left side: Secondary | ICD-10-CM | POA: Diagnosis not present

## 2020-09-12 DIAGNOSIS — M5116 Intervertebral disc disorders with radiculopathy, lumbar region: Secondary | ICD-10-CM | POA: Diagnosis not present

## 2020-09-12 DIAGNOSIS — G8929 Other chronic pain: Secondary | ICD-10-CM | POA: Diagnosis not present

## 2020-09-12 DIAGNOSIS — F319 Bipolar disorder, unspecified: Secondary | ICD-10-CM | POA: Diagnosis not present

## 2020-09-12 DIAGNOSIS — E1169 Type 2 diabetes mellitus with other specified complication: Secondary | ICD-10-CM | POA: Diagnosis not present

## 2020-09-12 DIAGNOSIS — M5432 Sciatica, left side: Secondary | ICD-10-CM | POA: Diagnosis not present

## 2020-09-12 DIAGNOSIS — E7849 Other hyperlipidemia: Secondary | ICD-10-CM | POA: Diagnosis not present

## 2020-09-12 DIAGNOSIS — G912 (Idiopathic) normal pressure hydrocephalus: Secondary | ICD-10-CM | POA: Diagnosis not present

## 2020-09-12 DIAGNOSIS — I10 Essential (primary) hypertension: Secondary | ICD-10-CM | POA: Diagnosis not present

## 2020-09-12 DIAGNOSIS — F0391 Unspecified dementia with behavioral disturbance: Secondary | ICD-10-CM | POA: Diagnosis not present

## 2020-09-17 DIAGNOSIS — I10 Essential (primary) hypertension: Secondary | ICD-10-CM | POA: Diagnosis not present

## 2020-09-17 DIAGNOSIS — E7849 Other hyperlipidemia: Secondary | ICD-10-CM | POA: Diagnosis not present

## 2020-09-17 DIAGNOSIS — M5432 Sciatica, left side: Secondary | ICD-10-CM | POA: Diagnosis not present

## 2020-09-17 DIAGNOSIS — G8929 Other chronic pain: Secondary | ICD-10-CM | POA: Diagnosis not present

## 2020-09-17 DIAGNOSIS — M5116 Intervertebral disc disorders with radiculopathy, lumbar region: Secondary | ICD-10-CM | POA: Diagnosis not present

## 2020-09-17 DIAGNOSIS — F319 Bipolar disorder, unspecified: Secondary | ICD-10-CM | POA: Diagnosis not present

## 2020-09-17 DIAGNOSIS — E1169 Type 2 diabetes mellitus with other specified complication: Secondary | ICD-10-CM | POA: Diagnosis not present

## 2020-09-17 DIAGNOSIS — G912 (Idiopathic) normal pressure hydrocephalus: Secondary | ICD-10-CM | POA: Diagnosis not present

## 2020-09-17 DIAGNOSIS — F0391 Unspecified dementia with behavioral disturbance: Secondary | ICD-10-CM | POA: Diagnosis not present

## 2020-09-18 ENCOUNTER — Other Ambulatory Visit: Payer: Self-pay

## 2020-09-18 DIAGNOSIS — I10 Essential (primary) hypertension: Secondary | ICD-10-CM | POA: Diagnosis not present

## 2020-09-18 DIAGNOSIS — E1169 Type 2 diabetes mellitus with other specified complication: Secondary | ICD-10-CM | POA: Diagnosis not present

## 2020-09-18 DIAGNOSIS — G912 (Idiopathic) normal pressure hydrocephalus: Secondary | ICD-10-CM | POA: Diagnosis not present

## 2020-09-18 DIAGNOSIS — M5116 Intervertebral disc disorders with radiculopathy, lumbar region: Secondary | ICD-10-CM | POA: Diagnosis not present

## 2020-09-18 DIAGNOSIS — G8929 Other chronic pain: Secondary | ICD-10-CM | POA: Diagnosis not present

## 2020-09-18 DIAGNOSIS — F0391 Unspecified dementia with behavioral disturbance: Secondary | ICD-10-CM | POA: Diagnosis not present

## 2020-09-18 DIAGNOSIS — F319 Bipolar disorder, unspecified: Secondary | ICD-10-CM | POA: Diagnosis not present

## 2020-09-18 DIAGNOSIS — E7849 Other hyperlipidemia: Secondary | ICD-10-CM | POA: Diagnosis not present

## 2020-09-18 DIAGNOSIS — M5432 Sciatica, left side: Secondary | ICD-10-CM | POA: Diagnosis not present

## 2020-09-18 NOTE — Patient Outreach (Signed)
[facility]) Care Management  09/18/2020  Michael Alvarado June 19, 1955 [id number]   Referral Date: 09/06/20 Referral Source: [facility] Nurse line Referral Reason: Needs help with placement   Telephone call to patient for follow up. Patient states that [facility] home health continues to be involved and they are working on helping him with placement to facility.  Patient denies need for further care management follow up as home health is helping with placement.  Plan: RN CM will close case.    Bary Leriche, RN, MSN [facility] Care Management Care Management Coordinator Direct Line [phone] Cell [phone] Toll Free: [phone]  Fax: [phone]

## 2020-09-19 DIAGNOSIS — I10 Essential (primary) hypertension: Secondary | ICD-10-CM | POA: Diagnosis not present

## 2020-09-19 DIAGNOSIS — M5116 Intervertebral disc disorders with radiculopathy, lumbar region: Secondary | ICD-10-CM | POA: Diagnosis not present

## 2020-09-19 DIAGNOSIS — E7849 Other hyperlipidemia: Secondary | ICD-10-CM | POA: Diagnosis not present

## 2020-09-19 DIAGNOSIS — G912 (Idiopathic) normal pressure hydrocephalus: Secondary | ICD-10-CM | POA: Diagnosis not present

## 2020-09-19 DIAGNOSIS — G8929 Other chronic pain: Secondary | ICD-10-CM | POA: Diagnosis not present

## 2020-09-19 DIAGNOSIS — F319 Bipolar disorder, unspecified: Secondary | ICD-10-CM | POA: Diagnosis not present

## 2020-09-19 DIAGNOSIS — E1169 Type 2 diabetes mellitus with other specified complication: Secondary | ICD-10-CM | POA: Diagnosis not present

## 2020-09-19 DIAGNOSIS — F0391 Unspecified dementia with behavioral disturbance: Secondary | ICD-10-CM | POA: Diagnosis not present

## 2020-09-19 DIAGNOSIS — M48062 Spinal stenosis, lumbar region with neurogenic claudication: Secondary | ICD-10-CM | POA: Diagnosis not present

## 2020-09-19 DIAGNOSIS — M5432 Sciatica, left side: Secondary | ICD-10-CM | POA: Diagnosis not present

## 2020-09-19 DIAGNOSIS — M5442 Lumbago with sciatica, left side: Secondary | ICD-10-CM | POA: Diagnosis not present

## 2020-09-21 DIAGNOSIS — M5442 Lumbago with sciatica, left side: Secondary | ICD-10-CM | POA: Diagnosis not present

## 2020-09-21 DIAGNOSIS — E785 Hyperlipidemia, unspecified: Secondary | ICD-10-CM | POA: Diagnosis not present

## 2020-09-21 DIAGNOSIS — G8929 Other chronic pain: Secondary | ICD-10-CM | POA: Diagnosis not present

## 2020-09-21 DIAGNOSIS — E1169 Type 2 diabetes mellitus with other specified complication: Secondary | ICD-10-CM | POA: Diagnosis not present

## 2020-09-21 DIAGNOSIS — M48062 Spinal stenosis, lumbar region with neurogenic claudication: Secondary | ICD-10-CM | POA: Diagnosis not present

## 2020-09-25 DIAGNOSIS — E1169 Type 2 diabetes mellitus with other specified complication: Secondary | ICD-10-CM | POA: Diagnosis not present

## 2020-09-25 DIAGNOSIS — E785 Hyperlipidemia, unspecified: Secondary | ICD-10-CM | POA: Diagnosis not present

## 2020-10-02 DIAGNOSIS — M5416 Radiculopathy, lumbar region: Secondary | ICD-10-CM | POA: Diagnosis not present

## 2020-10-02 DIAGNOSIS — E785 Hyperlipidemia, unspecified: Secondary | ICD-10-CM | POA: Diagnosis not present

## 2020-10-02 DIAGNOSIS — G912 (Idiopathic) normal pressure hydrocephalus: Secondary | ICD-10-CM | POA: Diagnosis not present

## 2020-10-16 DIAGNOSIS — M5442 Lumbago with sciatica, left side: Secondary | ICD-10-CM | POA: Diagnosis not present

## 2020-10-16 DIAGNOSIS — G8929 Other chronic pain: Secondary | ICD-10-CM | POA: Diagnosis not present

## 2020-10-16 DIAGNOSIS — M48062 Spinal stenosis, lumbar region with neurogenic claudication: Secondary | ICD-10-CM | POA: Diagnosis not present

## 2020-10-18 DIAGNOSIS — I10 Essential (primary) hypertension: Secondary | ICD-10-CM | POA: Diagnosis not present

## 2020-10-18 DIAGNOSIS — Z981 Arthrodesis status: Secondary | ICD-10-CM | POA: Diagnosis not present

## 2020-10-18 DIAGNOSIS — E782 Mixed hyperlipidemia: Secondary | ICD-10-CM | POA: Diagnosis not present

## 2020-10-18 DIAGNOSIS — G8929 Other chronic pain: Secondary | ICD-10-CM | POA: Diagnosis not present

## 2020-10-18 DIAGNOSIS — M5442 Lumbago with sciatica, left side: Secondary | ICD-10-CM | POA: Diagnosis not present

## 2020-10-18 DIAGNOSIS — F319 Bipolar disorder, unspecified: Secondary | ICD-10-CM | POA: Diagnosis not present

## 2020-12-13 DIAGNOSIS — Z981 Arthrodesis status: Secondary | ICD-10-CM | POA: Diagnosis not present

## 2020-12-13 DIAGNOSIS — M48062 Spinal stenosis, lumbar region with neurogenic claudication: Secondary | ICD-10-CM | POA: Diagnosis not present

## 2020-12-13 DIAGNOSIS — M4807 Spinal stenosis, lumbosacral region: Secondary | ICD-10-CM | POA: Diagnosis not present

## 2020-12-19 ENCOUNTER — Other Ambulatory Visit: Payer: Self-pay | Admitting: Neurosurgery

## 2020-12-19 DIAGNOSIS — M48062 Spinal stenosis, lumbar region with neurogenic claudication: Secondary | ICD-10-CM

## 2021-01-04 ENCOUNTER — Ambulatory Visit: Payer: Medicare HMO

## 2021-01-11 ENCOUNTER — Ambulatory Visit: Payer: Medicare HMO

## 2021-01-15 DIAGNOSIS — Z111 Encounter for screening for respiratory tuberculosis: Secondary | ICD-10-CM | POA: Diagnosis not present

## 2021-01-15 DIAGNOSIS — Z1152 Encounter for screening for COVID-19: Secondary | ICD-10-CM | POA: Diagnosis not present

## 2021-04-07 ENCOUNTER — Emergency Department
Admission: EM | Admit: 2021-04-07 | Discharge: 2021-04-07 | Disposition: A | Discharge status: Home / Self Care | Payer: Medicare Other | Attending: Student in an Organized Health Care Education/Training Program | Admitting: Student in an Organized Health Care Education/Training Program

## 2021-04-07 ENCOUNTER — Other Ambulatory Visit: Payer: Self-pay

## 2021-04-07 DIAGNOSIS — Z79899 Other long term (current) drug therapy: Secondary | ICD-10-CM | POA: Insufficient documentation

## 2021-04-07 DIAGNOSIS — E119 Type 2 diabetes mellitus without complications: Secondary | ICD-10-CM | POA: Insufficient documentation

## 2021-04-07 DIAGNOSIS — F0390 Unspecified dementia without behavioral disturbance: Secondary | ICD-10-CM | POA: Insufficient documentation

## 2021-04-07 DIAGNOSIS — M5432 Sciatica, left side: Secondary | ICD-10-CM | POA: Insufficient documentation

## 2021-04-07 DIAGNOSIS — M5442 Lumbago with sciatica, left side: Secondary | ICD-10-CM

## 2021-04-07 MED ORDER — HYDROCODONE-ACETAMINOPHEN 5-325 MG PO TABS
1.0000 | ORAL_TABLET | Freq: Once | ORAL | Status: AC
  Administered 2021-04-07: 1 via ORAL
  Filled 2021-04-07: qty 1

## 2021-04-07 MED ORDER — OXYCODONE-ACETAMINOPHEN 5-325 MG PO TABS: 1.0000 | ORAL_TABLET | ORAL | 0 refills | Status: DC | PRN

## 2021-04-07 NOTE — ED Provider Notes (Signed)
[facility] Emergency Department Provider Note    Event Date/Time   First MD Initiated Contact with Patient 04/07/21 1111     (approximate)  I have reviewed the triage vital signs and the nursing notes.   HISTORY  Chief Complaint Back Pain    HPI Michael Alvarado is a 65 y.o. male the below listed past medical history presents to the ER for evaluation of persistent low back pain rating down the back of his left leg.  Has been diagnosed with sciatica and just recently had injection prescription for tramadol.  Feels like the tramadol is not controlling his pain.  States that it comes and goes.  Denies any abdominal pain or upper back pain.  No dysuria.  No hematuria.  States he did have a fall but denies hitting his head.  He does not want any imaging done.  States that he walked to the ER today.  No loss of bowel or bladder control.   Past Medical History:  Diagnosis Date   Dementia (HCC)    Diabetes mellitus without complication (HCC)    Family History  Problem Relation Age of Onset   Other Mother    Hypertension Mother    Past Surgical History:  Procedure Laterality Date   BACK SURGERY     Patient Active Problem List   Diagnosis Date Noted   AMS (altered mental status) 04/29/2020   Dementia (HCC)    Diabetes mellitus without complication (HCC)    Hypertensive urgency       Prior to Admission medications   Medication Sig Start Date End Date Taking? Authorizing Provider  amLODipine (NORVASC) 10 MG tablet Take 1 tablet (10 mg total) by mouth daily. 04/30/20 07/29/20  Darlin PriestlyLai, Tina, MD  atorvastatin (LIPITOR) 80 MG tablet Take 80 mg by mouth at bedtime.    [provider]  butalbital-acetaminophen-caffeine (FIORICET) 50-325-40 MG tablet Take 1 tablet by mouth 2 (two) times daily as needed for headache. 04/30/20   Darlin PriestlyLai, Tina, MD  DULoxetine (CYMBALTA) 60 MG capsule Take 60 mg by mouth daily.    [provider]  famotidine (PEPCID) 20  MG tablet Take 20 mg by mouth 2 (two) times daily.    [provider]  losartan (COZAAR) 100 MG tablet Take 1 tablet (100 mg total) by mouth daily. 04/30/20 07/29/20  Darlin PriestlyLai, Tina, MD  metoprolol tartrate (LOPRESSOR) 50 MG tablet Take 1 tablet (50 mg total) by mouth 2 (two) times daily. 04/30/20 07/29/20  Darlin PriestlyLai, Tina, MD  QUEtiapine (SEROQUEL) 300 MG tablet Take 300 mg by mouth at bedtime.    [provider]  traMADol (ULTRAM) 50 MG tablet Take 50 mg by mouth every 6 (six) hours as needed for moderate pain.    [provider]  traZODone (DESYREL) 50 MG tablet Take 50 mg by mouth at bedtime.    [provider]    Allergies Patient has no known allergies.    Social History Social History   Tobacco Use   Smoking status: Never   Smokeless tobacco: Never  Substance Use Topics   Alcohol use: Yes    Comment: occasional   Drug use: Not Currently    Review of Systems Patient denies headaches, rhinorrhea, blurry vision, numbness, shortness of breath, chest pain, edema, cough, abdominal pain, nausea, vomiting, diarrhea, dysuria, fevers, rashes or hallucinations unless otherwise stated above in HPI. ____________________________________________   PHYSICAL EXAM:  VITAL SIGNS: Vitals:   04/07/21 0910  BP: (!) 144/105  Pulse: 89  Resp: 18  Temp: 98.6 F (37 C)  SpO2: 95%    Constitutional: Alert and oriented.  Eyes: Conjunctivae are normal.  Head: Atraumatic. Nose: No congestion/rhinnorhea. Mouth/Throat: Mucous membranes are moist.   Neck: No stridor. Painless ROM.  Cardiovascular: Normal rate, regular rhythm. Grossly normal heart sounds.  Good peripheral circulation. Respiratory: Normal respiratory effort.  No retractions. Lungs CTAB. Gastrointestinal: Soft and nontender. No distention. No abdominal bruits. No CVA tenderness. Genitourinary: deferred Musculoskeletal: No lower extremity tenderness nor edema.  No joint effusions.  + SLR on the left.  No  clonus.  DTRs equal. Neurologic:  Normal speech and language. No gross focal neurologic deficits are appreciated. No facial droop Skin:  Skin is warm, dry and intact. No rash noted. Psychiatric: Mood and affect are normal. Speech and behavior are normal.  ____________________________________________   LABS (all labs ordered are listed, but only abnormal results are displayed)  No results found for this or any previous visit (from the past 24 hour(s)). ____________________________________________ ____________________________________________  RADIOLOGY   ____________________________________________   PROCEDURES  Procedure(s) performed:  Procedures    Critical Care performed: no ____________________________________________   INITIAL IMPRESSION / ASSESSMENT AND PLAN / ED COURSE  Pertinent labs & imaging results that were available during my care of the patient were reviewed by me and considered in my medical decision making (see chart for details).   DDX: sciatica, .lumbago, fracture, CE, SS, pyelo, stone, AAA  Michael Alvarado is a 65 y.o. who presents to the ED with back pain as described above.  H.o spinal surgery but no recent back instrumentation/procedures. No fevers. Denies cord compression symptoms. No bowel/bladder incontinence or retention, no LE weakness.  + left SLR. VSS in ED. Exam with no LE weakness bilat., no sensory deficits, normal DTRs, no clonus, no saddle anesthesia. Pain with palpation of left lowback.  C/W suspected sciatica.  Patient declining any additional workup or imaging.  Requesting referral to neurosurgery.  History and physical exam less consistent with kidney stone or pyelonephritis.  Clinical picture is not consistent with epidural abscess , fracture, or cauda equina syndrome. Plan supportive care, follow up for recheck       The patient was evaluated in Emergency Department today for the symptoms described in the history of present illness.  He/she was evaluated in the context of the global COVID-19 pandemic, which necessitated consideration that the patient might be at risk for infection with the SARS-CoV-2 virus that causes COVID-19. Institutional protocols and algorithms that pertain to the evaluation of patients at risk for COVID-19 are in a state of rapid change based on information released by regulatory bodies including the CDC and federal and state organizations. These policies and algorithms were followed during the patient's care in the ED.  As part of my medical decision making, I reviewed the following data within the electronic MEDICAL RECORD NUMBER Nursing notes reviewed and incorporated, Labs reviewed, notes from prior ED visits and [place] Controlled Substance Database   ____________________________________________   FINAL CLINICAL IMPRESSION(S) / ED DIAGNOSES  Final diagnoses:  Acute left-sided low back pain with left-sided sciatica      NEW MEDICATIONS STARTED DURING THIS VISIT:  New Prescriptions   No medications on file     Note:  This document was prepared using Dragon voice recognition software and may include unintentional dictation errors.    Willy Eddy, MD 04/07/21 1126

## 2021-04-07 NOTE — ED Triage Notes (Signed)
First nurse note: pt comes ems from [facility] with back pain for 2 weeks. States radiates to left leg. PCP wanted him to keep taking pain meds but states those haven't been helping.

## 2021-04-17 ENCOUNTER — Other Ambulatory Visit: Payer: Self-pay

## 2021-04-17 ENCOUNTER — Emergency Department
Admission: EM | Admit: 2021-04-17 | Discharge: 2021-04-17 | Disposition: A | Discharge status: Home / Self Care | Payer: Medicare Other | Attending: Emergency Medicine | Admitting: Emergency Medicine

## 2021-04-17 ENCOUNTER — Encounter: Payer: Self-pay | Admitting: Emergency Medicine

## 2021-04-17 DIAGNOSIS — E119 Type 2 diabetes mellitus without complications: Secondary | ICD-10-CM | POA: Insufficient documentation

## 2021-04-17 DIAGNOSIS — K219 Gastro-esophageal reflux disease without esophagitis: Secondary | ICD-10-CM | POA: Insufficient documentation

## 2021-04-17 DIAGNOSIS — M5441 Lumbago with sciatica, right side: Secondary | ICD-10-CM | POA: Diagnosis present

## 2021-04-17 DIAGNOSIS — F0390 Unspecified dementia without behavioral disturbance: Secondary | ICD-10-CM | POA: Insufficient documentation

## 2021-04-17 DIAGNOSIS — Z79899 Other long term (current) drug therapy: Secondary | ICD-10-CM | POA: Insufficient documentation

## 2021-04-17 DIAGNOSIS — R42 Dizziness and giddiness: Secondary | ICD-10-CM | POA: Diagnosis not present

## 2021-04-17 DIAGNOSIS — G8929 Other chronic pain: Secondary | ICD-10-CM | POA: Insufficient documentation

## 2021-04-17 MED ORDER — ALUM & MAG HYDROXIDE-SIMETH 200-200-20 MG/5ML PO SUSP
30.0000 mL | Freq: Once | ORAL | Status: AC
  Administered 2021-04-17: 30 mL via ORAL
  Filled 2021-04-17: qty 30

## 2021-04-17 NOTE — ED Triage Notes (Signed)
Comes into the ED via [facility] from home c/o low back pain that has been ongoing for 6-8 months.  Pt denies any urinary problems.  Pt states he has had a h/o back operations, but denies any known back injury.  Pt states that his pain feels like his chronic back pain.  Pt in NAD with even and unlabored respirations.

## 2021-04-17 NOTE — ED Notes (Signed)
Pt is lying on his back, states that he called 911 after walking into the [facility] today for help, pt states that he felt dizzy and like the world was spinning around, pt asked if he has that sensation as he is lying down and pt states not today, pt has old abrasions to his lower legs from frequent falls.

## 2021-04-17 NOTE — ED Provider Notes (Signed)
[facility] Emergency Department Provider Note    ____________________________________________   Event Date/Time   First MD Initiated Contact with Patient 04/17/21 1724     (approximate)  I have reviewed the triage vital signs and the nursing notes.   HISTORY  Chief Complaint Back Pain    HPI Michael Alvarado is a 65 y.o. male, history of diabetes, hypertension, and dementia, presents to the emergency department for evaluation back pain. Patient states that he is has suffered from low back pain for the past 8 months. He has a hx of spinal surgery.  He was recently evaluated for this in the ED on 04/07/2021, where his work-up was found to be unremarkable for any acute pathology. When asked if anything had changed since then, he said no.  He pivots, and states that he has heartburn right now and feels "lightheaded" sometimes.  Denies chest pain, abdominal pain, saddle anesthesia, fever, urinary symptoms.   History limited by: Dementia.  Past Medical History:  Diagnosis Date   Dementia (HCC)    Diabetes mellitus without complication [facility])     Patient Active Problem List   Diagnosis Date Noted   AMS (altered mental status) 04/29/2020   Dementia (HCC)    Diabetes mellitus without complication (HCC)    Hypertensive urgency     Past Surgical History:  Procedure Laterality Date   BACK SURGERY      Prior to Admission medications   Medication Sig Start Date End Date Taking? Authorizing Provider  amLODipine (NORVASC) 10 MG tablet Take 1 tablet (10 mg total) by mouth daily. 04/30/20 07/29/20  Darlin Priestly, MD  atorvastatin (LIPITOR) 80 MG tablet Take 80 mg by mouth at bedtime.    [provider]  butalbital-acetaminophen-caffeine (FIORICET) 50-325-40 MG tablet Take 1 tablet by mouth 2 (two) times daily as needed for headache. 04/30/20   Darlin Priestly, MD  DULoxetine (CYMBALTA) 60 MG capsule Take 60 mg by mouth daily.    [provider]  famotidine  (PEPCID) 20 MG tablet Take 20 mg by mouth 2 (two) times daily.    [provider]  losartan (COZAAR) 100 MG tablet Take 1 tablet (100 mg total) by mouth daily. 04/30/20 07/29/20  Darlin Priestly, MD  metoprolol tartrate (LOPRESSOR) 50 MG tablet Take 1 tablet (50 mg total) by mouth 2 (two) times daily. 04/30/20 07/29/20  Darlin Priestly, MD  oxyCODONE-acetaminophen (PERCOCET) 5-325 MG tablet Take 1 tablet by mouth every 4 (four) hours as needed for severe pain. 04/07/21 04/07/22  Willy Eddy, MD  QUEtiapine (SEROQUEL) 300 MG tablet Take 300 mg by mouth at bedtime.    [provider]  traMADol (ULTRAM) 50 MG tablet Take 50 mg by mouth every 6 (six) hours as needed for moderate pain.    [provider]  traZODone (DESYREL) 50 MG tablet Take 50 mg by mouth at bedtime.    [provider]    Allergies Patient has no known allergies.  Family History  Problem Relation Age of Onset   Other Mother    Hypertension Mother     Social History Social History   Tobacco Use   Smoking status: Never   Smokeless tobacco: Never  Substance Use Topics   Alcohol use: Yes    Comment: occasional   Drug use: Not Currently    Review of Systems  Constitutional: Negative for fever/chills, weight loss, or fatigue.  Eyes: Negative for visual changes or discharge.  ENT: Negative for congestion, hearing changes, or  sore throat.  Gastrointestinal: Positive for heartburn negative for abdominal pain, nausea/vomiting, or diarrhea.  Genitourinary: Negative for dysuria or hematuria.  Musculoskeletal: Positive for back pain negative for joint pain.  Skin: Negative for rashes or lesions.  Neurological: Positive for lightheadedness negative for headache, syncope, dizziness, tremors, or numbness/tingling.   10-point ROS otherwise negative. ____________________________________________   PHYSICAL EXAM:  VITAL SIGNS: ED Triage Vitals  Enc Vitals Group     BP 04/17/21 1643 (!) 125/97      Pulse Rate 04/17/21 1643 77     Resp 04/17/21 1643 17     Temp 04/17/21 1643 97.6 F (36.4 C)     Temp Source 04/17/21 1643 Oral     SpO2 04/17/21 1643 96 %     Weight 04/17/21 1444 238 lb 1.6 oz (108 kg)     Height 04/17/21 1444 5\' 11"  (1.803 m)     Head Circumference --      Peak Flow --      Pain Score 04/17/21 1444 4     Pain Loc --      Pain Edu? --      Excl. in [place]? --     Physical Exam Constitutional:      Appearance: Normal appearance.  HENT:     Head: Normocephalic and atraumatic.     Nose: Nose normal.     Mouth/Throat:     Mouth: Mucous membranes are moist.     Pharynx: Oropharynx is clear. No oropharyngeal exudate.  Eyes:     Extraocular Movements: Extraocular movements intact.     Conjunctiva/sclera: Conjunctivae normal.     Pupils: Pupils are equal, round, and reactive to light.  Cardiovascular:     Rate and Rhythm: Normal rate and regular rhythm.     Heart sounds: Normal heart sounds. No murmur heard.   No friction rub. No gallop.  Pulmonary:     Effort: Pulmonary effort is normal.     Breath sounds: Normal breath sounds.  Abdominal:     General: Abdomen is flat.     Palpations: Abdomen is soft.  Musculoskeletal:        General: Normal range of motion.     Cervical back: Normal range of motion and neck supple.     Comments: No spinal or paraspinal tenderness noted.  Reports tingling sensation with straight leg test on the right side.  Normal strength and range of motion in all extremities.  Sensation intact.  Skin:    General: Skin is warm and dry.  Neurological:     General: No focal deficit present.     Mental Status: He is alert and oriented to person, place, and time.     Cranial Nerves: No cranial nerve deficit.     Sensory: No sensory deficit.     Motor: No weakness.  Psychiatric:     Comments: Patient is calm and polite, but appears disorganized in his thought content.  Reports difficulty remembering his medical history and his doctors.      ____________________________________________    LABS  (all labs ordered are listed, but only abnormal results are displayed)  Labs Reviewed - No data to display   ____________________________________________   EKG Sinus rhythm with occasional PVCs, rate of 81, no ST segment changes, no axis deviations.   ____________________________________________    RADIOLOGY I personally viewed and evaluated these images as part of my medical decision making, as well as reviewing the written report by the radiologist.  ED Provider  Interpretation: None.  No results found.  ____________________________________________   PROCEDURES  Procedures   Medications  alum & mag hydroxide-simeth (MAALOX/MYLANTA) 200-200-20 MG/5ML suspension 30 mL (30 mLs Oral Given 04/17/21 1830)    Critical Care performed: No  ____________________________________________   INITIAL IMPRESSION / ASSESSMENT AND PLAN / ED COURSE  Pertinent labs & imaging results that were available during my care of the patient were reviewed by me and considered in my medical decision making (see chart for details).       SKIPPER MACHIN is a 65 y.o. male, history of diabetes, hypertension, and dementia, presents to the emergency department for evaluation of low back pain. Upon further discussion, patient does not appear to have any active problems.  As he speaks he mentions that he is beginning to have some heartburn, as well as mentions that he gets lightheaded occasionally when getting up too fast.  Although he endorses low back pain, he states that he does not think that anything has changed since last time he was here in the emergency department and is not looking for any further work-up.  He does however, ask to be admitted to the hospital. When asked why, he is unclear.  On exam, patient appears well.  No spinal or paraspinal tenderness.  No neurological deficits.  Lung sounds clear bilaterally and no concerning  heart sounds. Vital signs are within normal limits.   Given the patient's endorsement of heartburn at the moment, will treat with GI cocktail.  EKG ordered to evaluate for cardiac pathology potentially associated with his lightheadedness, that was found to be unremarkable.  Otherwise no labs, imaging, or treatments indicated at this time.  Patient's motivation for arrival to the ED is unclear.  When asked about who he lives with, he says " whoever will take me".  He mentions bouncing back and forth between his mother's house and his sister's house.  I had a frank discussion with the patient, explaining to him and there are findings consistent with any acute pathology that required any further evaluation in the emergency department or admission to the hospital.  When asked if he had any concerns about his living situation, he stated " no, I am fine".  I explained to him that now that we have treated his heartburn and evaluated his lightheadedness, I think it would be appropriate to discharge him and follow-up with his primary care provider. He agreed with this plan. Patient was discharged with his primary care providers contact information.       ____________________________________________   FINAL CLINICAL IMPRESSION(S) / ED DIAGNOSES  Final diagnoses:  Chronic bilateral low back pain with right-sided sciatica  Gastroesophageal reflux disease without esophagitis     NEW MEDICATIONS STARTED DURING THIS VISIT:  ED Discharge Orders     None        Note:  This document was prepared using Dragon voice recognition software and may include unintentional dictation errors.    Teodoro Spray, [place] 04/17/21 2037    Naaman Plummer, MD 04/18/21 [phone]

## 2021-04-18 ENCOUNTER — Inpatient Hospital Stay
Admission: EM | Admit: 2021-04-18 | Discharge: 2021-05-01 | DRG: 176 | Disposition: A | Discharge status: Home / Self Care | Payer: Medicare Other | Attending: Hospitalist | Admitting: Hospitalist

## 2021-04-18 ENCOUNTER — Emergency Department: Payer: Medicare Other

## 2021-04-18 ENCOUNTER — Observation Stay: Payer: Medicare Other

## 2021-04-18 ENCOUNTER — Other Ambulatory Visit: Payer: Self-pay

## 2021-04-18 DIAGNOSIS — E860 Dehydration: Secondary | ICD-10-CM | POA: Diagnosis present

## 2021-04-18 DIAGNOSIS — R6510 Systemic inflammatory response syndrome (SIRS) of non-infectious origin without acute organ dysfunction: Secondary | ICD-10-CM | POA: Diagnosis present

## 2021-04-18 DIAGNOSIS — Z6837 Body mass index (BMI) 37.0-37.9, adult: Secondary | ICD-10-CM

## 2021-04-18 DIAGNOSIS — Z8249 Family history of ischemic heart disease and other diseases of the circulatory system: Secondary | ICD-10-CM

## 2021-04-18 DIAGNOSIS — Y92009 Unspecified place in unspecified non-institutional (private) residence as the place of occurrence of the external cause: Secondary | ICD-10-CM

## 2021-04-18 DIAGNOSIS — N179 Acute kidney failure, unspecified: Secondary | ICD-10-CM | POA: Diagnosis present

## 2021-04-18 DIAGNOSIS — R7989 Other specified abnormal findings of blood chemistry: Secondary | ICD-10-CM | POA: Diagnosis present

## 2021-04-18 DIAGNOSIS — E119 Type 2 diabetes mellitus without complications: Secondary | ICD-10-CM | POA: Diagnosis not present

## 2021-04-18 DIAGNOSIS — R778 Other specified abnormalities of plasma proteins: Secondary | ICD-10-CM

## 2021-04-18 DIAGNOSIS — R7303 Prediabetes: Secondary | ICD-10-CM | POA: Diagnosis present

## 2021-04-18 DIAGNOSIS — G8929 Other chronic pain: Secondary | ICD-10-CM | POA: Diagnosis present

## 2021-04-18 DIAGNOSIS — I82411 Acute embolism and thrombosis of right femoral vein: Secondary | ICD-10-CM | POA: Diagnosis present

## 2021-04-18 DIAGNOSIS — E785 Hyperlipidemia, unspecified: Secondary | ICD-10-CM | POA: Diagnosis present

## 2021-04-18 DIAGNOSIS — F0390 Unspecified dementia without behavioral disturbance: Secondary | ICD-10-CM | POA: Diagnosis present

## 2021-04-18 DIAGNOSIS — M549 Dorsalgia, unspecified: Secondary | ICD-10-CM | POA: Diagnosis present

## 2021-04-18 DIAGNOSIS — F32A Depression, unspecified: Secondary | ICD-10-CM | POA: Diagnosis present

## 2021-04-18 DIAGNOSIS — Z5900 Homelessness unspecified: Secondary | ICD-10-CM

## 2021-04-18 DIAGNOSIS — W19XXXA Unspecified fall, initial encounter: Secondary | ICD-10-CM

## 2021-04-18 DIAGNOSIS — I2699 Other pulmonary embolism without acute cor pulmonale: Principal | ICD-10-CM | POA: Diagnosis present

## 2021-04-18 DIAGNOSIS — W1830XA Fall on same level, unspecified, initial encounter: Secondary | ICD-10-CM | POA: Diagnosis present

## 2021-04-18 DIAGNOSIS — K219 Gastro-esophageal reflux disease without esophagitis: Secondary | ICD-10-CM | POA: Diagnosis present

## 2021-04-18 DIAGNOSIS — R55 Syncope and collapse: Secondary | ICD-10-CM | POA: Diagnosis present

## 2021-04-18 DIAGNOSIS — I10 Essential (primary) hypertension: Secondary | ICD-10-CM | POA: Diagnosis present

## 2021-04-18 DIAGNOSIS — E876 Hypokalemia: Secondary | ICD-10-CM | POA: Diagnosis not present

## 2021-04-18 DIAGNOSIS — D72829 Elevated white blood cell count, unspecified: Secondary | ICD-10-CM | POA: Insufficient documentation

## 2021-04-18 DIAGNOSIS — Z20822 Contact with and (suspected) exposure to covid-19: Secondary | ICD-10-CM | POA: Diagnosis present

## 2021-04-18 DIAGNOSIS — E669 Obesity, unspecified: Secondary | ICD-10-CM | POA: Diagnosis present

## 2021-04-18 DIAGNOSIS — F0393 Unspecified dementia, unspecified severity, with mood disturbance: Secondary | ICD-10-CM | POA: Diagnosis present

## 2021-04-18 DIAGNOSIS — Z79899 Other long term (current) drug therapy: Secondary | ICD-10-CM

## 2021-04-18 DIAGNOSIS — S80212A Abrasion, left knee, initial encounter: Secondary | ICD-10-CM | POA: Diagnosis present

## 2021-04-18 DIAGNOSIS — I712 Thoracic aortic aneurysm, without rupture, unspecified: Secondary | ICD-10-CM | POA: Diagnosis present

## 2021-04-18 DIAGNOSIS — S80211A Abrasion, right knee, initial encounter: Secondary | ICD-10-CM | POA: Diagnosis present

## 2021-04-18 DIAGNOSIS — I248 Other forms of acute ischemic heart disease: Secondary | ICD-10-CM | POA: Diagnosis present

## 2021-04-18 HISTORY — DX: Essential (primary) hypertension: I10

## 2021-04-18 LAB — CBC WITH DIFFERENTIAL/PLATELET
Abs Immature Granulocytes: 0.08 10*3/uL — ABNORMAL HIGH (ref 0.00–0.07)
Basophils Absolute: 0.1 10*3/uL (ref 0.0–0.1)
Basophils Relative: 0 %
Eosinophils Absolute: 0.1 10*3/uL (ref 0.0–0.5)
Eosinophils Relative: 0 %
HCT: 48 % (ref 39.0–52.0)
Hemoglobin: 17.5 g/dL — ABNORMAL HIGH (ref 13.0–17.0)
Immature Granulocytes: 1 %
Lymphocytes Relative: 11 %
Lymphs Abs: 1.6 10*3/uL (ref 0.7–4.0)
MCH: 33 pg (ref 26.0–34.0)
MCHC: 36.5 g/dL — ABNORMAL HIGH (ref 30.0–36.0)
MCV: 90.6 fL (ref 80.0–100.0)
Monocytes Absolute: 1.4 10*3/uL — ABNORMAL HIGH (ref 0.1–1.0)
Monocytes Relative: 10 %
Neutro Abs: 10.9 10*3/uL — ABNORMAL HIGH (ref 1.7–7.7)
Neutrophils Relative %: 78 %
Platelets: 257 10*3/uL (ref 150–400)
RBC: 5.3 MIL/uL (ref 4.22–5.81)
RDW: 13.6 % (ref 11.5–15.5)
WBC: 14 10*3/uL — ABNORMAL HIGH (ref 4.0–10.5)
nRBC: 0 % (ref 0.0–0.2)

## 2021-04-18 LAB — URINALYSIS, ROUTINE W REFLEX MICROSCOPIC
Glucose, UA: NEGATIVE mg/dL
Hgb urine dipstick: NEGATIVE
Ketones, ur: 20 mg/dL — AB
Leukocytes,Ua: NEGATIVE
Nitrite: NEGATIVE
Protein, ur: NEGATIVE mg/dL
Specific Gravity, Urine: 1.026 (ref 1.005–1.030)
pH: 5 (ref 5.0–8.0)

## 2021-04-18 LAB — BASIC METABOLIC PANEL
Anion gap: 15 (ref 5–15)
Anion gap: 13 (ref 5–15)
Anion gap: 14 (ref 5–15)
BUN: 32 mg/dL — ABNORMAL HIGH (ref 8–23)
BUN: 34 mg/dL — ABNORMAL HIGH (ref 8–23)
BUN: 33 mg/dL — ABNORMAL HIGH (ref 8–23)
CO2: 20 mmol/L — ABNORMAL LOW (ref 22–32)
CO2: 22 mmol/L (ref 22–32)
CO2: 23 mmol/L (ref 22–32)
Calcium: 10.2 mg/dL (ref 8.9–10.3)
Calcium: 10.5 mg/dL — ABNORMAL HIGH (ref 8.9–10.3)
Calcium: 10.4 mg/dL — ABNORMAL HIGH (ref 8.9–10.3)
Chloride: 102 mmol/L (ref 98–111)
Chloride: 101 mmol/L (ref 98–111)
Chloride: 98 mmol/L (ref 98–111)
Creatinine, Ser: 1.95 mg/dL — ABNORMAL HIGH (ref 0.61–1.24)
Creatinine, Ser: 1.59 mg/dL — ABNORMAL HIGH (ref 0.61–1.24)
Creatinine, Ser: 1.56 mg/dL — ABNORMAL HIGH (ref 0.61–1.24)
GFR, Estimated: 37 mL/min — ABNORMAL LOW (ref >60)
GFR, Estimated: 47 mL/min — ABNORMAL LOW (ref >60)
GFR, Estimated: 48 mL/min — ABNORMAL LOW (ref >60)
Glucose, Bld: 166 mg/dL — ABNORMAL HIGH (ref 70–99)
Glucose, Bld: 152 mg/dL — ABNORMAL HIGH (ref 70–99)
Glucose, Bld: 132 mg/dL — ABNORMAL HIGH (ref 70–99)
Potassium: 2.9 mmol/L — ABNORMAL LOW (ref 3.5–5.1)
Potassium: 3 mmol/L — ABNORMAL LOW (ref 3.5–5.1)
Potassium: 3.5 mmol/L (ref 3.5–5.1)
Sodium: 137 mmol/L (ref 135–145)
Sodium: 136 mmol/L (ref 135–145)
Sodium: 135 mmol/L (ref 135–145)

## 2021-04-18 LAB — TROPONIN I (HIGH SENSITIVITY)
Troponin I (High Sensitivity): 20 ng/L — ABNORMAL HIGH (ref <18)
Troponin I (High Sensitivity): 20 ng/L — ABNORMAL HIGH (ref <18)
Troponin I (High Sensitivity): 17 ng/L (ref <18)

## 2021-04-18 LAB — RESP PANEL BY RT-PCR (FLU A&B, COVID) ARPGX2
Influenza A by PCR: NEGATIVE
Influenza B by PCR: NEGATIVE
SARS Coronavirus 2 by RT PCR: NEGATIVE

## 2021-04-18 LAB — HEPATIC FUNCTION PANEL
ALT: 29 U/L (ref 0–44)
AST: 24 U/L (ref 15–41)
Albumin: 4.1 g/dL (ref 3.5–5.0)
Alkaline Phosphatase: 87 U/L (ref 38–126)
Bilirubin, Direct: 0.2 mg/dL (ref 0.0–0.2)
Indirect Bilirubin: 1.1 mg/dL — ABNORMAL HIGH (ref 0.3–0.9)
Total Bilirubin: 1.3 mg/dL — ABNORMAL HIGH (ref 0.3–1.2)
Total Protein: 7.5 g/dL (ref 6.5–8.1)

## 2021-04-18 LAB — APTT: aPTT: 31 seconds (ref 24–36)

## 2021-04-18 LAB — PROTIME-INR
INR: 1.2 (ref 0.8–1.2)
Prothrombin Time: 14.7 seconds (ref 11.4–15.2)

## 2021-04-18 LAB — PROCALCITONIN: Procalcitonin: 0.13 ng/mL

## 2021-04-18 LAB — LACTIC ACID, PLASMA: Lactic Acid, Venous: 1.8 mmol/L (ref 0.5–1.9)

## 2021-04-18 LAB — ETHANOL: Alcohol, Ethyl (B): <10 mg/dL (ref <10)

## 2021-04-18 LAB — CK: Total CK: 393 U/L (ref 49–397)

## 2021-04-18 MED ORDER — ONDANSETRON HCL 4 MG/2ML IJ SOLN: 4.0000 mg | Freq: Three times a day (TID) | INTRAMUSCULAR | Status: DC | PRN

## 2021-04-18 MED ORDER — LACTATED RINGERS IV BOLUS
1000.0000 mL | Freq: Once | INTRAVENOUS | Status: AC
  Administered 2021-04-18: 12:00:00 1000 mL via INTRAVENOUS

## 2021-04-18 MED ORDER — ALBUTEROL SULFATE (2.5 MG/3ML) 0.083% IN NEBU: 2.5000 mg | INHALATION_SOLUTION | RESPIRATORY_TRACT | Status: DC | PRN

## 2021-04-18 MED ORDER — LACTATED RINGERS IV BOLUS
1000.0000 mL | Freq: Once | INTRAVENOUS | Status: AC
  Administered 2021-04-18: 16:00:00 1000 mL via INTRAVENOUS

## 2021-04-18 MED ORDER — LIDOCAINE VISCOUS HCL 2 % MT SOLN
15.0000 mL | Freq: Once | OROMUCOSAL | Status: AC
  Administered 2021-04-18: 13:00:00 15 mL via ORAL
  Filled 2021-04-18: qty 15

## 2021-04-18 MED ORDER — LACTATED RINGERS IV SOLN: INTRAVENOUS | Status: DC

## 2021-04-18 MED ORDER — AMLODIPINE BESYLATE 5 MG PO TABS
10.0000 mg | ORAL_TABLET | Freq: Once | ORAL | Status: AC
  Administered 2021-04-18: 12:00:00 10 mg via ORAL
  Filled 2021-04-18: qty 2

## 2021-04-18 MED ORDER — ATORVASTATIN CALCIUM 10 MG PO TABS
10.0000 mg | ORAL_TABLET | Freq: Every day | ORAL | Status: DC
  Administered 2021-04-18 – 2021-04-30 (×13): 10 mg via ORAL
  Filled 2021-04-18 (×13): qty 1

## 2021-04-18 MED ORDER — AMLODIPINE BESYLATE 5 MG PO TABS
5.0000 mg | ORAL_TABLET | Freq: Every day | ORAL | Status: DC
  Administered 2021-04-18 – 2021-05-01 (×14): 5 mg via ORAL
  Filled 2021-04-18 (×14): qty 1

## 2021-04-18 MED ORDER — DM-GUAIFENESIN ER 30-600 MG PO TB12
1.0000 | ORAL_TABLET | Freq: Two times a day (BID) | ORAL | Status: DC | PRN
  Administered 2021-04-19 – 2021-04-21 (×2): 1 via ORAL
  Filled 2021-04-18 (×2): qty 1

## 2021-04-18 MED ORDER — QUETIAPINE FUMARATE 300 MG PO TABS
300.0000 mg | ORAL_TABLET | Freq: Every day | ORAL | Status: DC
  Administered 2021-04-18 – 2021-04-30 (×13): 300 mg via ORAL
  Filled 2021-04-18 (×14): qty 1

## 2021-04-18 MED ORDER — ENOXAPARIN SODIUM 60 MG/0.6ML IJ SOSY
0.5000 mg/kg | PREFILLED_SYRINGE | INTRAMUSCULAR | Status: DC
  Administered 2021-04-18: 16:00:00 57.5 mg via SUBCUTANEOUS
  Filled 2021-04-18: qty 0.6

## 2021-04-18 MED ORDER — ACETAMINOPHEN 325 MG PO TABS: 650.0000 mg | ORAL_TABLET | Freq: Four times a day (QID) | ORAL | Status: DC | PRN

## 2021-04-18 MED ORDER — DICYCLOMINE HCL 10 MG/5ML PO SOLN
10.0000 mg | Freq: Once | ORAL | Status: AC
  Administered 2021-04-18: 13:00:00 10 mg via ORAL
  Filled 2021-04-18 (×3): qty 5

## 2021-04-18 MED ORDER — GABAPENTIN 300 MG PO CAPS
300.0000 mg | ORAL_CAPSULE | Freq: Three times a day (TID) | ORAL | Status: DC
  Administered 2021-04-18 – 2021-05-01 (×39): 300 mg via ORAL
  Filled 2021-04-18 (×40): qty 1

## 2021-04-18 MED ORDER — OXYCODONE-ACETAMINOPHEN 5-325 MG PO TABS: 1.0000 | ORAL_TABLET | ORAL | Status: DC | PRN

## 2021-04-18 MED ORDER — IOHEXOL 300 MG/ML  SOLN
75.0000 mL | Freq: Once | INTRAMUSCULAR | Status: AC | PRN
  Administered 2021-04-18: 13:00:00 75 mL via INTRAVENOUS

## 2021-04-18 MED ORDER — ALUM & MAG HYDROXIDE-SIMETH 200-200-20 MG/5ML PO SUSP
30.0000 mL | Freq: Once | ORAL | Status: AC
  Administered 2021-04-18: 13:00:00 30 mL via ORAL
  Filled 2021-04-18: qty 30

## 2021-04-18 MED ORDER — HYDRALAZINE HCL 20 MG/ML IJ SOLN: 5.0000 mg | INTRAMUSCULAR | Status: DC | PRN

## 2021-04-18 MED ORDER — HEPARIN (PORCINE) 25000 UT/250ML-% IV SOLN
1450.0000 [IU]/h | INTRAVENOUS | Status: DC
  Administered 2021-04-18: 18:00:00 1600 [IU]/h via INTRAVENOUS
  Filled 2021-04-18: qty 250

## 2021-04-18 MED ORDER — BUTALBITAL-APAP-CAFFEINE 50-325-40 MG PO TABS: 1.0000 | ORAL_TABLET | Freq: Two times a day (BID) | ORAL | Status: DC | PRN

## 2021-04-18 MED ORDER — CARVEDILOL 25 MG PO TABS
25.0000 mg | ORAL_TABLET | Freq: Two times a day (BID) | ORAL | Status: DC
  Administered 2021-04-18 – 2021-05-01 (×27): 25 mg via ORAL
  Filled 2021-04-18 (×12): qty 1
  Filled 2021-04-18: qty 4
  Filled 2021-04-18 (×8): qty 1
  Filled 2021-04-18: qty 4
  Filled 2021-04-18: qty 1
  Filled 2021-04-18: qty 4
  Filled 2021-04-18 (×3): qty 1

## 2021-04-18 MED ORDER — OXYCODONE-ACETAMINOPHEN 5-325 MG PO TABS
1.0000 | ORAL_TABLET | Freq: Four times a day (QID) | ORAL | Status: DC | PRN
  Administered 2021-04-19 – 2021-04-24 (×11): 1 via ORAL
  Filled 2021-04-18 (×12): qty 1

## 2021-04-18 MED ORDER — IOHEXOL 300 MG/ML  SOLN
75.0000 mL | Freq: Once | INTRAMUSCULAR | Status: AC | PRN
  Administered 2021-04-18: 17:00:00 75 mL via INTRAVENOUS
  Filled 2021-04-18: qty 75

## 2021-04-18 NOTE — ED Notes (Signed)
Patient eating lunch tray at this time °

## 2021-04-18 NOTE — ED Provider Notes (Signed)
[facility] Emergency Department Provider Note   ____________________________________________   Event Date/Time   First MD Initiated Contact with Patient 04/18/21 1010     (approximate)  I have reviewed the triage vital signs and the nursing notes.   HISTORY  Chief Complaint Fall   HPI Michael Alvarado is a 65 y.o. male with a history of diabetes and dementia who reports he was walking across [place] to get "somewhere."  He does not appear to have a place to live.  He says he was walking so long that he just got dizzy.  He has a history of chronic back pain per the old records and he tells me that his whole back is been achy for quite some time now.  Again he said he just got dizzy from walking too much.         Past Medical History:  Diagnosis Date   Dementia (HCC)    Diabetes mellitus without complication [facility])     Patient Active Problem List   Diagnosis Date Noted   AMS (altered mental status) 04/29/2020   Dementia (HCC)    Diabetes mellitus without complication (HCC)    Hypertensive urgency     Past Surgical History:  Procedure Laterality Date   BACK SURGERY      Prior to Admission medications   Medication Sig Start Date End Date Taking? Authorizing Provider  amLODipine (NORVASC) 10 MG tablet Take 1 tablet (10 mg total) by mouth daily. 04/30/20 07/29/20  Darlin Priestly, MD  atorvastatin (LIPITOR) 80 MG tablet Take 80 mg by mouth at bedtime.    [provider]  butalbital-acetaminophen-caffeine (FIORICET) 50-325-40 MG tablet Take 1 tablet by mouth 2 (two) times daily as needed for headache. 04/30/20   Darlin Priestly, MD  DULoxetine (CYMBALTA) 60 MG capsule Take 60 mg by mouth daily.    [provider]  famotidine (PEPCID) 20 MG tablet Take 20 mg by mouth 2 (two) times daily.    [provider]  losartan (COZAAR) 100 MG tablet Take 1 tablet (100 mg total) by mouth daily. 04/30/20 07/29/20  Darlin Priestly, MD  metoprolol  tartrate (LOPRESSOR) 50 MG tablet Take 1 tablet (50 mg total) by mouth 2 (two) times daily. 04/30/20 07/29/20  Darlin Priestly, MD  oxyCODONE-acetaminophen (PERCOCET) 5-325 MG tablet Take 1 tablet by mouth every 4 (four) hours as needed for severe pain. 04/07/21 04/07/22  Willy Eddy, MD  QUEtiapine (SEROQUEL) 300 MG tablet Take 300 mg by mouth at bedtime.    [provider]  traMADol (ULTRAM) 50 MG tablet Take 50 mg by mouth every 6 (six) hours as needed for moderate pain.    [provider]  traZODone (DESYREL) 50 MG tablet Take 50 mg by mouth at bedtime.    [provider]    Allergies Patient has no known allergies.  Family History  Problem Relation Age of Onset   Other Mother    Hypertension Mother     Social History Social History   Tobacco Use   Smoking status: Never   Smokeless tobacco: Never  Substance Use Topics   Alcohol use: Yes    Comment: occasional   Drug use: Not Currently    Review of Systems  Constitutional: No fever/chills Eyes: No visual changes. ENT: No sore throat. Cardiovascular: Denies chest pain. Respiratory: Denies shortness of breath. Gastrointestinal: No abdominal pain.  No nausea, no vomiting.  No diarrhea.  No constipation. Genitourinary: Negative for dysuria. Musculoskeletal: Chronic diffuse  back pain. Skin: Negative for rash. Neurological: Negative for headaches, focal weakness  ____________________________________________   PHYSICAL EXAM:  VITAL SIGNS: ED Triage Vitals [04/18/21 0453]  Enc Vitals Group     BP (!) 121/97     Pulse Rate 95     Resp 18     Temp 98.4 F (36.9 C)     Temp Source Oral     SpO2 94 %     Weight      Height      Head Circumference      Peak Flow      Pain Score      Pain Loc      Pain Edu?      Excl. in [place]?     Constitutional: Alert and oriented. Well appearing and in no acute distress. Eyes: Conjunctivae are normal. PER Head: Atraumatic. Nose: No  congestion/rhinnorhea. Mouth/Throat: Mucous membranes are moist.  Oropharynx non-erythematous. Neck: No stridor.  Cardiovascular: Normal rate, regular rhythm. Grossly normal heart sounds.  Good peripheral circulation. Respiratory: Normal respiratory effort.  No retractions. Lungs CTAB. Gastrointestinal: Soft and nontender. No distention. No abdominal bruits. No CVA tenderness.  Mild diffuse spinal tenderness not tender to percussion Musculoskeletal: No lower extremity tenderness nor edema.   Neurologic:  Normal speech and language. No gross focal neurologic deficits are appreciated.  Skin:  Skin is warm, dry and intact.  Patient has abrasions on both knees fairly large abrasion   ____________________________________________   LABS (all labs ordered are listed, but only abnormal results are displayed)  Labs Reviewed  BASIC METABOLIC PANEL - Abnormal; Notable for the following components:      Result Value   Potassium 3.0 (*)    Glucose, Bld 152 (*)    BUN 34 (*)    Creatinine, Ser 1.59 (*)    Calcium 10.5 (*)    GFR, Estimated 47 (*)    All other components within normal limits  HEPATIC FUNCTION PANEL - Abnormal; Notable for the following components:   Total Bilirubin 1.3 (*)    Indirect Bilirubin 1.1 (*)    All other components within normal limits  BASIC METABOLIC PANEL - Abnormal; Notable for the following components:   Glucose, Bld 132 (*)    BUN 33 (*)    Creatinine, Ser 1.56 (*)    Calcium 10.4 (*)    GFR, Estimated 48 (*)    All other components within normal limits  TROPONIN I (HIGH SENSITIVITY) - Abnormal; Notable for the following components:   Troponin I (High Sensitivity) 20 (*)    All other components within normal limits  RESP PANEL BY RT-PCR (FLU A&B, COVID) [id number]  URINALYSIS, ROUTINE W REFLEX MICROSCOPIC  TROPONIN I (HIGH SENSITIVITY)  TROPONIN I (HIGH SENSITIVITY)   ____________________________________________  EKG EKG read interpreted by me shows  sinus tachycardia rate of 101 normal axis flipped T's in 1 and F which were not present yesterday.  Troponins have been stable at 20 however.  ____________________________________________  RADIOLOGY Jill Poling, personally viewed and evaluated these images (plain radiographs) as part of my medical decision making, as well as reviewing the written report by the radiologist.  ED MD interpretation: Chest x-ray read by radiology reviewed by me is reporting a right apical density.  Official radiology report(s): DG Chest Portable 1 View  Result Date: 04/18/2021 CLINICAL DATA:  Cough, weakness. EXAM: PORTABLE CHEST 1 VIEW COMPARISON:  April 29, 2020. FINDINGS: Stable cardiomediastinal silhouette. Left lung is clear. Right  apical density is now noted, although it may be due to the patient being rotated. Possible mass cannot be excluded. 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 Bony thorax is unremarkable. IMPRESSION: Right apical density is now noted. CT scan of the chest with intravenous contrast is recommended to rule out nodule or mass. Electronically Signed   By: Lupita Raider M.D.   On: 04/18/2021 11:15    ____________________________________________   PROCEDURES  Procedure(s) performed (including Critical Care): Critical care time 30 minutes.  This includes reviewing the patient's studies both new and old and the chest x-ray.  I also discussed the patient with social work and will have to talk to the hospitalist here momentarily.  I had a long discussion with the patient also about where he was going and what he was trying to accomplish.  Procedures   ____________________________________________   INITIAL IMPRESSION / ASSESSMENT AND PLAN / ED COURSE   Patient with history of dementia who is near as I can tell was walking across town in an effort to find a place to spend the night.  He got so tired and dehydrated that he apparently fell down.  He did not appear  to pass out.  Patient now has acute kidney injury.  Additionally he has a mass in his right upper lobe of his chest or at least a shadow on x-ray which we will have to evaluate with CT of chest with contrast.  Patient is not safe to leave as he has no place to live and is been wandering around and the temperatures for the last several nights in the next several nights as well or below freezing I should say considerably below freezing and the patient is already having medical issues which she did not have recently.  We will have to get the CT to see if the patient has a right upper lobe pneumonia or cancerous mass benign tumor tuberculoma or something else.             ____________________________________________   FINAL CLINICAL IMPRESSION(S) / ED DIAGNOSES  Final diagnoses:  Fall, initial encounter  AKI (acute kidney injury) [facility])     ED Discharge Orders     None        Note:  This document was prepared using Dragon voice recognition software and may include unintentional dictation errors.    Arnaldo Natal, MD 04/18/21 [phone]

## 2021-04-18 NOTE — TOC Initial Note (Signed)
Transition of Care [facility]) - Initial/Assessment Note    Patient Details  Name: Michael Alvarado MRN: [id number] Date of Birth: 06-Jun-1955  [facility]) CM/SW Contact:    Michael Alvarado Phone Number: [phone] 04/18/2021, 1:31 PM  Clinical Narrative:                  Patient presents to [facility] due to dizziness.  Patient has dx of dementia and diabetes. CSW spoke with Michael Alvarado, which is following this patient, but not have made contact with him these last two weeks.  Michael Alvarado stated the patient does have a HCPOA, his sister Michael Alvarado [phone]. CSW called and left Michael Alvarado a Engineer, technical sales.  Michael Alvarado stated they will contact the social worker who is following the patient and have her contact this CSW or the hospital. Michael Alvarado social worker stated the address on the patient's chart is the address they have on file but she did not know if he was still living there or not. Attending and ED RN updated.  Expected Discharge Plan: Skilled Nursing Facility Barriers to Discharge: Continued Medical Work up   Patient Goals and CMS Choice        Expected Discharge Plan and Services Expected Discharge Plan: Skilled Nursing Facility In-house Referral: Clinical Social Work   Post Acute Care Choice: Skilled Nursing Facility Living arrangements for the past 2 months: Single Family Home                                      Prior Living Arrangements/Services Living arrangements for the past 2 months: Single Family Home Lives with:: Self Patient language and need for interpreter reviewed:: Yes Do you feel safe going back to the place where you live?: Yes      Need for Family Participation in Patient Care: Yes (Comment) Care giver support system in place?: No (comment) Current home services: Meals on wheels Criminal Activity/Legal Involvement Pertinent to Current Situation/Hospitalization: No - Comment as needed  Activities  of Daily Living      Permission Sought/Granted Permission sought to share information with : Family Supports    Share Information with NAME: Michael Alvarado (sister) [phone]           Emotional Assessment Appearance:: Appears older than stated age, Malodorous Attitude/Demeanor/Rapport: Engaged Affect (typically observed): Stable Orientation: : Oriented to Self, Oriented to Place, Oriented to  Time, Oriented to Situation Alcohol / Substance Use: Not Applicable Psych Involvement: No (comment)  Admission diagnosis:  AKI (acute kidney injury) (HCC) [N17.9] Patient Active Problem List   Diagnosis Date Noted   AKI (acute kidney injury) (HCC) 04/18/2021   HTN (hypertension) 04/18/2021   Homeless 04/18/2021   Fall at home, initial encounter 04/18/2021   Elevated troponin 04/18/2021   Leukocytosis 04/18/2021   Hypercalcemia 04/18/2021   Chronic back pain 04/18/2021   AMS (altered mental status) 04/29/2020   Dementia (HCC)    Diabetes mellitus without complication (HCC)    Hypertensive urgency    PCP:  Michael Penton, MD Pharmacy:   [facility] [phone] Michael Alvarado, [facility] - [address] [place] [address] Phone: [phone] Fax: [phone]     Social Determinants of Health (SDOH) Interventions    Readmission Risk Interventions No flowsheet data found.

## 2021-04-18 NOTE — Progress Notes (Signed)
PHARMACIST - PHYSICIAN COMMUNICATION  CONCERNING:  Enoxaparin (Lovenox) for DVT Prophylaxis    RECOMMENDATION: Patient was prescribed enoxaparin 40 mg q24 hours for VTE prophylaxis.   Filed Weights   04/18/21 1216  Weight: 114.8 kg (253 lb)    Body mass index is 37.36 kg/m.  Estimated Creatinine Clearance: 57.4 mL/min (A) (by C-G formula based on SCr of 1.56 mg/dL (H)).   Based on [facility] policy patient is candidate for enoxaparin 0.5 mg/kg TBW SQ every 24 hours based on BMI > 30.   DESCRIPTION: Pharmacy has adjusted enoxaparin dose per [facility] policy.  Patient is now receiving enoxaparin 57.5 mg every 24 hours    Pricilla Riffle, PharmD, BCPS Clinical Pharmacist 04/18/2021 2:31 PM

## 2021-04-18 NOTE — ED Triage Notes (Signed)
See paper chart for downtime 

## 2021-04-18 NOTE — ED Notes (Signed)
This RN helped patient get out of pants/underwear with stool. Patient requested underwear and pants be thrown away.  Belongings: Cell phone, wallet, 1 $5 bill and 1 $1 bill on bedside table.

## 2021-04-18 NOTE — ED Notes (Signed)
IV infiltrated, IV team consult placed

## 2021-04-18 NOTE — TOC Progression Note (Signed)
Transition of Care [facility]) - Progression Note    Patient Details  Name: Michael Alvarado MRN: [id number] Date of Birth: 1955/08/20  Transition of [facility]) CM/SW Contact  Marina Goodell Phone Number: [phone] 04/18/2021, 2:20 PM  Clinical Narrative:     CSW spoke with Michael Alvarado [phone], [facility] DSS social worker who is following the patient.  Ms. Drue Flirt stated the patient was living with Smith,Linda (Sister) [phone] but she threw him out of the home and did not find placement for him.  Ms. Drue Flirt stated she would come to the ED and speak with the patient.  Expected Discharge Plan: Skilled Nursing Facility Barriers to Discharge: Continued Medical Work up  Expected Discharge Plan and Services Expected Discharge Plan: Skilled Nursing Facility In-house Referral: Clinical Social Work   Post Acute Care Choice: Skilled Nursing Facility Living arrangements for the past 2 months: Single Family Home                                       Social Determinants of Health (SDOH) Interventions    Readmission Risk Interventions No flowsheet data found.

## 2021-04-18 NOTE — Progress Notes (Addendum)
Cross Cover Report received from Dr McDouglas with [facility] radiology of Nonocclusive DVT within the RIGHT femoral and RIGHT popliteal veins.  Patient already being treated with IV heparin for pulmonary embolism.

## 2021-04-18 NOTE — Progress Notes (Signed)
ANTICOAGULATION CONSULT NOTE  Pharmacy Consult for heparin infusion Indication: pulmonary embolus  No Known Allergies  Patient Measurements: Height: 5\' 9"  (175.3 cm) Weight: 114.8 kg (253 lb) IBW/kg (Calculated) : 70.7 Heparin Dosing Weight: 96.3 kg  Vital Signs: BP: 133/84 (11/17 1300) Pulse Rate: 78 (11/17 1300)  Labs: Recent Labs    04/18/21 0130 04/18/21 0450 04/18/21 1114 04/18/21 1159  HGB 17.5*  --   --   --   HCT 48.0  --   --   --   PLT 257  --   --   --   CREATININE 1.95* 1.59*  --  1.56*  CKTOTAL 393  --   --   --   TROPONINIHS 20* 20* 17  --     Estimated Creatinine Clearance: 57.4 mL/min (A) (by C-G formula based on SCr of 1.56 mg/dL (H)).   Medical History: Past Medical History:  Diagnosis Date   Dementia (HCC)    Diabetes mellitus without complication (HCC)    Hypertension     Medications:  No anticoagulation noted PTA  Assessment: 65 y.o. male with medical history significant for dementia, hypertension, hyperlipidemia, diabetes mellitus, GERD, depression, chronic back pain, who presented with fall. Pharmacy has been consulted for heparin dosing and monitoring.   Baseline labs: Hgb 17.5, Hct/Plt wnl; aPTT, PT-INR pending  Goal of Therapy:  Heparin level 0.3-0.7 units/ml Monitor platelets by anticoagulation protocol: Yes   Plan:  Will not give initial bolus since pt received lovenox at 1541 and will start heparin infusion at 1600 units/hr Check anti-Xa level in 6 hours and daily while on heparin Continue to monitor H&H and platelets  Florabel Faulks O Toniesha Zellner 04/18/2021,5:31 PM

## 2021-04-18 NOTE — TOC Progression Note (Signed)
Transition of Care [facility]) - Progression Note    Patient Details  Name: Michael Alvarado MRN: [id number] Date of Birth: Oct 06, 1955  [facility]) CM/SW Contact  Joseph Art, [place] Phone Number: 04/18/2021, 4:32 PM  Clinical Narrative:       CSW spoke with Nancy Fetter [phone], requested FL2 for possible placement.  CSW emailed FL2 to [email or web address].   Expected Discharge Plan: Skilled Nursing Facility Barriers to Discharge: Continued Medical Work up  Expected Discharge Plan and Services Expected Discharge Plan: Skilled Nursing Facility In-house Referral: Clinical Social Work   Post Acute Care Choice: Skilled Nursing Facility Living arrangements for the past 2 months: Single Family Home                                       Social Determinants of Health (SDOH) Interventions    Readmission Risk Interventions No flowsheet data found.

## 2021-04-18 NOTE — ED Notes (Signed)
Patient transported to X-ray 

## 2021-04-18 NOTE — ED Notes (Signed)
Patient has call bell at bedside and urinal. Patient unhooked self from monitor and peed in floor. Patient asked to press call bell or use urinal and not pee in the floor. This RN was in patients room 5 minutes before and reported he was fine.

## 2021-04-18 NOTE — ED Notes (Signed)
Patient back from CT.

## 2021-04-18 NOTE — [place] FL2 (Signed)
[place] MEDICAID FL2 LEVEL OF CARE SCREENING TOOL     IDENTIFICATION  Patient Name: Michael Alvarado Birthdate: 09/26/1955 Sex: male Admission Date (Current Location): 04/18/2021  [facility] and [place] Number:  [name] [id number] [facility] Facility and Address:  [facility], [address] [place], [place] [address]      Provider Number: [id number]  Attending Physician Name and Address:  Lorretta Harp, MD  Relative Name and Phone Number:  [name] (Sister)   [phone] [facility])    Current Level of Care: Hospital Recommended Level of Care: Assisted Living Facility, [facility], Other (Comment) (Group Home) Prior Approval Number:    Date Approved/Denied:   PASRR Number:    Discharge Plan:      Current Diagnoses: Patient Active Problem List   Diagnosis Date Noted   AKI (acute kidney injury) (HCC) 04/18/2021   HTN (hypertension) 04/18/2021   Homeless 04/18/2021   Fall at home, initial encounter 04/18/2021   Elevated troponin 04/18/2021   Leukocytosis 04/18/2021   Hypercalcemia 04/18/2021   Chronic back pain 04/18/2021   SIRS (systemic inflammatory response syndrome) (HCC) 04/18/2021   Depression 04/18/2021   AMS (altered mental status) 04/29/2020   Dementia (HCC)    Diabetes mellitus without complication (HCC)    Hypertensive urgency     Orientation RESPIRATION BLADDER Height & Weight     Self, Place, Time  Normal Continent Weight: 253 lb (114.8 kg) Height:  5\' 9"  (175.3 cm)  BEHAVIORAL SYMPTOMS/MOOD NEUROLOGICAL BOWEL NUTRITION STATUS  Wanderer   Continent Diet  AMBULATORY STATUS COMMUNICATION OF NEEDS Skin   Supervision Verbally Normal                       Personal Care Assistance Level of Assistance  Bathing, Feeding, Dressing, Total care Bathing Assistance: Independent Feeding assistance: Independent Dressing Assistance: Independent Total Care Assistance: Limited assistance   Functional Limitations Info  Sight, Hearing,  Speech Sight Info: Adequate Hearing Info: Adequate Speech Info: Adequate    SPECIAL CARE FACTORS FREQUENCY                       Contractures Contractures Info: Not present    Additional Factors Info      [facility] DSS/APS contact: [facility] [phone]             Current Medications (04/18/2021):  This is the current hospital active medication list Current Facility-Administered Medications  Medication Dose Route Frequency Provider Last Rate Last Admin   acetaminophen (TYLENOL) tablet 650 mg  650 mg Oral Q6H PRN 04/20/2021, MD       albuterol (PROVENTIL) (2.5 MG/3ML) 0.083% nebulizer solution 2.5 mg  2.5 mg Inhalation Q4H PRN Lorretta Harp, MD       amLODipine (NORVASC) tablet 5 mg  5 mg Oral Daily Lorretta Harp, MD   5 mg at 04/18/21 1537   atorvastatin (LIPITOR) tablet 10 mg  10 mg Oral QHS 04/20/21, MD       butalbital-acetaminophen-caffeine (FIORICET) [phone] MG per tablet 1 tablet  1 tablet Oral BID PRN [phone], MD       carvedilol (COREG) tablet 25 mg  25 mg Oral BID Lorretta Harp, MD   25 mg at 04/18/21 1536   dextromethorphan-guaiFENesin (MUCINEX DM) 30-600 MG per 12 hr tablet 1 tablet  1 tablet Oral BID PRN 04/20/21, MD       enoxaparin (LOVENOX) injection 57.5 mg  0.5 mg/kg Subcutaneous Q24H  Lorretta Harp, MD   57.5 mg at 04/18/21 1541   gabapentin (NEURONTIN) capsule 300 mg  300 mg Oral TID Lorretta Harp, MD   300 mg at 04/18/21 1537   hydrALAZINE (APRESOLINE) injection 5 mg  5 mg Intravenous Q2H PRN Lorretta Harp, MD       iohexol (OMNIPAQUE) 300 MG/ML solution 75 mL  75 mL Intravenous Once PRN Lorretta Harp, MD       lactated ringers infusion   Intravenous Continuous Lorretta Harp, MD       ondansetron [facility]) injection 4 mg  4 mg Intravenous Q8H PRN Lorretta Harp, MD       oxyCODONE-acetaminophen (PERCOCET/ROXICET) 5-325 MG per tablet 1 tablet  1 tablet Oral Q6H PRN Lorretta Harp, MD       QUEtiapine (SEROQUEL) tablet 300 mg  300 mg Oral QHS Lorretta Harp, MD        Current Outpatient Medications  Medication Sig Dispense Refill   amLODipine (NORVASC) 5 MG tablet Take 5 mg by mouth daily.     atorvastatin (LIPITOR) 10 MG tablet Take 10 mg by mouth daily.     bumetanide (BUMEX) 1 MG tablet Take 1 mg by mouth daily.     carvedilol (COREG) 25 MG tablet Take 25 mg by mouth 2 (two) times daily.     gabapentin (NEURONTIN) 300 MG capsule Take 300-600 mg by mouth in the morning, at noon, and at bedtime.     hydrochlorothiazide (HYDRODIURIL) 25 MG tablet Take 25 mg by mouth daily.     QUEtiapine (SEROQUEL) 300 MG tablet Take 300 mg by mouth at bedtime.     traMADol (ULTRAM) 50 MG tablet Take 50 mg by mouth every 6 (six) hours as needed for moderate pain.     butalbital-acetaminophen-caffeine (FIORICET) 50-325-40 MG tablet Take 1 tablet by mouth 2 (two) times daily as needed for headache. 14 tablet 0   losartan (COZAAR) 100 MG tablet Take 100 mg by mouth daily. (Patient not taking: Reported on 04/18/2021)     metoprolol tartrate (LOPRESSOR) 50 MG tablet Take 1 tablet (50 mg total) by mouth 2 (two) times daily. 60 tablet 2   oxyCODONE-acetaminophen (PERCOCET) 5-325 MG tablet Take 1 tablet by mouth every 4 (four) hours as needed for severe pain. (Patient not taking: Reported on 04/18/2021) 7 tablet 0     Discharge Medications: Please see discharge summary for a list of discharge medications.  Relevant Imaging Results:  Relevant Lab Results:   Additional Information SS# [id number]  Joseph Art, LCSWA

## 2021-04-18 NOTE — ED Notes (Signed)
This RN with IV insertion attempts x 2, no success.

## 2021-04-18 NOTE — H&P (Addendum)
History and Physical    TABORIS TEDROW [id number] DOB: 13-Jun-1955 DOA: 04/18/2021  Referring MD/NP/PA:   PCP: Rusty Aus, MD   Patient coming from:  The patient is coming from homeless   Chief Complaint: fall  HPI: Michael Alvarado is a 65 y.o. male with medical history significant of homeless, dementia, hypertension, hyperlipidemia, diabetes mellitus, GERD, depression, chronic back pain, who presents with fall.   Per report, pt was walking across [place] to get "somewhere."  He says he was walking so long that he just got dizzy, tired and fell.  He denies any significant injury.  No headache or neck pain.  No loss of consciousness.  He refused CT scan of head and neck.  He has bruise in both knees.  He states that he had nausea and vomited twice earlier, which has resolved.  Currently patient denies nausea, vomiting, diarrhea or abdominal pain.  Denies symptoms of UTI.  Patient states that he has chronic shortness of breath which has not changed.  He also reports mild dry cough, no chest pain.  No fever or chills. He has a history of chronic back pain, which has been persistent, without significant change recently.   ED Course: pt was found to have AKI with creatinine 1.56, BUN 33 (baseline creatinine 0.83 on 04/30/2020), WBC 14.0, troponin level 20, 20, 17, negative COVID PCR, CK level 393, alcohol level less than 10, temperature normal, blood thinning blood pressure 140/113, heart rate 95, 86, RR 28, oxygen saturation 94-97% on room air.  Chest x-ray showed a right apical density.  CXR showed: Right apical density is now noted. CT scan of the chest with intravenous contrast is recommended to rule out nodule or mass  Review of Systems:   General: no fevers, chills, no body weight gain, has fatigue HEENT: no blurry vision, hearing changes or sore throat Respiratory: has dyspnea, coughing, no wheezing CV: no chest pain, no palpitations GI: had nausea, vomiting, no abdominal pain,  diarrhea, constipation GU: no dysuria, burning on urination, increased urinary frequency, hematuria  Ext: no leg edema Neuro: no unilateral weakness, numbness, or tingling, no vision change or hearing loss Skin: no rash. Has bruises in both knees MSK: No muscle spasm, no deformity, no limitation of range of movement in spin Heme: No easy bruising.  Travel history: No recent long distant travel.  Allergy: No Known Allergies  Past Medical History:  Diagnosis Date   Dementia ([place])    Diabetes mellitus without complication ([place])    Hypertension     Past Surgical History:  Procedure Laterality Date   BACK SURGERY      Social History:  reports that he has never smoked. He has never used smokeless tobacco. He reports current alcohol use. He reports that he does not currently use drugs.  Family History:  Family History  Problem Relation Age of Onset   Other Mother    Hypertension Mother      Prior to Admission medications   Medication Sig Start Date End Date Taking? Authorizing Provider  amLODipine (NORVASC) 10 MG tablet Take 1 tablet (10 mg total) by mouth daily. 04/30/20 07/29/20  Enzo Bi, MD  atorvastatin (LIPITOR) 80 MG tablet Take 80 mg by mouth at bedtime.    [provider]  butalbital-acetaminophen-caffeine (FIORICET) 50-325-40 MG tablet Take 1 tablet by mouth 2 (two) times daily as needed for headache. 04/30/20   Enzo Bi, MD  DULoxetine (CYMBALTA) 60 MG capsule Take 60 mg by mouth  daily.    [provider]  famotidine (PEPCID) 20 MG tablet Take 20 mg by mouth 2 (two) times daily.    [provider]  losartan (COZAAR) 100 MG tablet Take 1 tablet (100 mg total) by mouth daily. 04/30/20 07/29/20  Darlin Priestly, MD  metoprolol tartrate (LOPRESSOR) 50 MG tablet Take 1 tablet (50 mg total) by mouth 2 (two) times daily. 04/30/20 07/29/20  Darlin Priestly, MD  oxyCODONE-acetaminophen (PERCOCET) 5-325 MG tablet Take 1 tablet by mouth every 4 (four) hours as needed  for severe pain. 04/07/21 04/07/22  Willy Eddy, MD  QUEtiapine (SEROQUEL) 300 MG tablet Take 300 mg by mouth at bedtime.    [provider]  traMADol (ULTRAM) 50 MG tablet Take 50 mg by mouth every 6 (six) hours as needed for moderate pain.    [provider]  traZODone (DESYREL) 50 MG tablet Take 50 mg by mouth at bedtime.    [provider]    Physical Exam: Vitals:   04/18/21 1145 04/18/21 1200 04/18/21 1216 04/18/21 1300  BP:  (!) 140/113  133/84  Pulse: 90 86  78  Resp: (!) 28 20  19   Temp:      TempSrc:      SpO2: 94% 96%  97%  Weight:   114.8 kg   Height:   5\' 9"  (1.753 m)    General: Not in acute distress. Dry mucous membrane HEENT:       Eyes: PERRL, EOMI, no scleral icterus.       ENT: No discharge from the ears and nose, no pharynx injection, no tonsillar enlargement.        Neck: No JVD, no bruit, no mass felt. Heme: No neck lymph node enlargement. Cardiac: S1/S2, RRR, No murmurs, No gallops or rubs. Respiratory: No rales, wheezing, rhonchi or rubs. GI: Soft, nondistended, nontender, no rebound pain, no organomegaly, BS present. GU: No hematuria Ext: No pitting leg edema bilaterally. 1+DP/PT pulse bilaterally. Musculoskeletal: No joint deformities, No joint redness or warmth, no limitation of ROM in spin. Skin: No rashes. Has bruises in both knees Neuro: Alert, oriented X3, cranial nerves II-XII grossly intact, moves all extremities normally.  Psych: Patient is not psychotic, no suicidal or hemocidal ideation.  Labs on Admission: I have personally reviewed following labs and imaging studies  CBC: Recent Labs  Lab 04/18/21 0130  WBC 14.0*  NEUTROABS 10.9*  HGB 17.5*  HCT 48.0  MCV 90.6  PLT 257   Basic Metabolic Panel: Recent Labs  Lab 04/18/21 0130 04/18/21 0450 04/18/21 1159  NA 137 136 135  K 2.9* 3.0* 3.5  CL 102 101 98  CO2 20* 22 23  GLUCOSE 166* 152* 132*  BUN 32* 34* 33*  CREATININE 1.95* 1.59* 1.56*   CALCIUM 10.2 10.5* 10.4*   GFR: Estimated Creatinine Clearance: 57.4 mL/min (A) (by C-G formula based on SCr of 1.56 mg/dL (H)). Liver Function Tests: Recent Labs  Lab 04/18/21 0450  AST 24  ALT 29  ALKPHOS 87  BILITOT 1.3*  PROT 7.5  ALBUMIN 4.1   No results for input(s): LIPASE, AMYLASE in the last 168 hours. No results for input(s): AMMONIA in the last 168 hours. Coagulation Profile: No results for input(s): INR, PROTIME in the last 168 hours. Cardiac Enzymes: Recent Labs  Lab 04/18/21 0130  CKTOTAL 393   BNP (last 3 results) No results for input(s): PROBNP in the last 8760 hours. HbA1C: No results for input(s): HGBA1C in the last 72 hours.  CBG: No results for input(s): GLUCAP in the last 168 hours. Lipid Profile: No results for input(s): CHOL, HDL, LDLCALC, TRIG, CHOLHDL, LDLDIRECT in the last 72 hours. Thyroid Function Tests: No results for input(s): TSH, T4TOTAL, FREET4, T3FREE, THYROIDAB in the last 72 hours. Anemia Panel: No results for input(s): VITAMINB12, FOLATE, FERRITIN, TIBC, IRON, RETICCTPCT in the last 72 hours. Urine analysis:    Component Value Date/Time   COLORURINE YELLOW (A) 04/18/2021 1114   APPEARANCEUR HAZY (A) 04/18/2021 1114   LABSPEC 1.026 04/18/2021 1114   PHURINE 5.0 04/18/2021 1114   GLUCOSEU NEGATIVE 04/18/2021 1114   HGBUR NEGATIVE 04/18/2021 1114   BILIRUBINUR SMALL (A) 04/18/2021 1114   KETONESUR 20 (A) 04/18/2021 1114   PROTEINUR NEGATIVE 04/18/2021 1114   NITRITE NEGATIVE 04/18/2021 1114   LEUKOCYTESUR NEGATIVE 04/18/2021 1114   Sepsis Labs: @LABRCNTIP (procalcitonin:4,lacticidven:4) ) Recent Results (from the past 240 hour(s))  Resp Panel by RT-PCR (Flu A&B, Covid) Nasopharyngeal Swab     Status: None   Collection Time: 04/18/21 11:14 AM   Specimen: Nasopharyngeal Swab; Nasopharyngeal(NP) swabs in vial transport medium  Result Value Ref Range Status   SARS Coronavirus 2 by RT PCR NEGATIVE NEGATIVE Final    Comment:  (NOTE) SARS-CoV-2 target nucleic acids are NOT DETECTED.  The SARS-CoV-2 RNA is generally detectable in upper respiratory specimens during the acute phase of infection. The lowest concentration of SARS-CoV-2 viral copies this assay can detect is 138 copies/mL. A negative result does not preclude SARS-Cov-2 infection and should not be used as the sole basis for treatment or other patient management decisions. A negative result may occur with  improper specimen collection/handling, submission of specimen other than nasopharyngeal swab, presence of viral mutation(s) within the areas targeted by this assay, and inadequate number of viral copies(<138 copies/mL). A negative result must be combined with clinical observations, patient history, and epidemiological information. The expected result is Negative.  Fact Sheet for Patients:  EntrepreneurPulse.com.au  Fact Sheet for Healthcare Providers:  IncredibleEmployment.be  This test is no t yet approved or cleared by the [place] FDA and  has been authorized for detection and/or diagnosis of SARS-CoV-2 by FDA under an Emergency Use Authorization (EUA). This EUA will remain  in effect (meaning this test can be used) for the duration of the COVID-19 declaration under Section 564(b)(1) of the Act, 21 U.S.C.section 360bbb-3(b)(1), unless the authorization is terminated  or revoked sooner.       Influenza A by PCR NEGATIVE NEGATIVE Final   Influenza B by PCR NEGATIVE NEGATIVE Final    Comment: (NOTE) The Xpert Xpress SARS-CoV-2/FLU/RSV plus assay is intended as an aid in the diagnosis of influenza from Nasopharyngeal swab specimens and should not be used as a sole basis for treatment. Nasal washings and aspirates are unacceptable for Xpert Xpress SARS-CoV-2/FLU/RSV testing.  Fact Sheet for Patients: EntrepreneurPulse.com.au  Fact Sheet for Healthcare  Providers: IncredibleEmployment.be  This test is not yet approved or cleared by the [place] FDA and has been authorized for detection and/or diagnosis of SARS-CoV-2 by FDA under an Emergency Use Authorization (EUA). This EUA will remain in effect (meaning this test can be used) for the duration of the COVID-19 declaration under Section 564(b)(1) of the Act, 21 U.S.C. section 360bbb-3(b)(1), unless the authorization is terminated or revoked.  Performed at [facility], [address] [place], [place] [address]      Radiological Exams on Admission: CT Chest W Contrast  Result Date: 04/18/2021 CLINICAL DATA:  Recent syncopal episode  with abnormal chest x-ray, initial encounter EXAM: CT CHEST WITH CONTRAST TECHNIQUE: Multidetector CT imaging of the chest was performed during intravenous contrast administration. CONTRAST:  80mL OMNIPAQUE IOHEXOL 300 MG/ML  SOLN COMPARISON:  Chest x-ray from earlier in the same day. FINDINGS: Cardiovascular: Thoracic aorta and its branches demonstrate atherosclerotic calcification. Mild dilatation of the ascending aorta to 4.5 cm is noted. Normal tapering is seen distally in the descending thoracic aorta. Diffuse coronary calcifications are seen. No cardiac enlargement is noted. The pulmonary artery is not timed for embolus evaluation although multiple filling defects are identified most prominent in the left lower lobe but to a lesser degree in the right lower lobe consistent with pulmonary emboli. No evidence of right heart strain is seen. Mediastinum/Nodes: Thoracic inlet is within normal limits. No sizable hilar or mediastinal adenopathy is noted. Mild mediastinal fatty prominence is noted. The esophagus as visualized is within normal limits. Lungs/Pleura: Lungs are well aerated bilaterally. Minimal atelectatic changes are noted in the bases. No sizable infiltrate or parenchymal nodule is noted. The area of abnormality on prior  chest x-ray is related to the prominent mediastinal fat and tortuous vascularity. Patient rotation to the right also accentuated these changes on the prior plain film. 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 Fatty infiltration of the liver is seen. A small renal cyst is noted on the right. Musculoskeletal: Degenerative changes of the thoracic spine are noted. No acute rib abnormality is seen. No compression deformities are noted. IMPRESSION: Note is made of incidental pulmonary embolism particularly in the left lower lobe. No evidence of right heart strain is noted. Dilatation of the ascending aorta to 4.5 cm with normal tapering distally. Ascending thoracic aortic aneurysm. Recommend semi-annual imaging followup by CTA or MRA and referral to cardiothoracic surgery if not already obtained. This recommendation follows 2010 ACCF/AHA/AATS/ACR/ASA/SCA/SCAI/SIR/STS/SVM Guidelines for the Diagnosis and Management of Patients With Thoracic Aortic Disease. Circulation. 2010; [id number]:[id number]. Aortic aneurysm NOS (ICD10-I71.9) Right apical abnormality on prior chest x-ray is related to tortuous vascularity and mild mediastinal lipomatosis as well as patient rotation on the prior exam. Aortic Atherosclerosis (ICD10-I70.0). Critical Value/emergent results were called by telephone at the time of interpretation on 04/18/2021 at 5:16 pm to Dr. Blaine Hamper, who verbally acknowledged these results. Electronically Signed   By: Inez Catalina M.D.   On: 04/18/2021 17:19   DG Chest Portable 1 View  Result Date: 04/18/2021 CLINICAL DATA:  Cough, weakness. EXAM: PORTABLE CHEST 1 VIEW COMPARISON:  April 29, 2020. FINDINGS: Stable cardiomediastinal silhouette. Left lung is clear. Right apical density is now noted, although it may be due to the patient being rotated. Possible mass cannot be excluded. 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 Bony thorax  is unremarkable. IMPRESSION: Right apical density is now noted. CT scan of the chest with intravenous contrast is recommended to rule out nodule or mass. Electronically Signed   By: Marijo Conception M.D.   On: 04/18/2021 11:15     EKG: I have personally reviewed.  Sinus rhythm, QTC 471, heart rate 101, nonspecific T wave change  Assessment/Plan Principal Problem:   AKI (acute kidney injury) ([place]) Active Problems:   Dementia ([place])   Diabetes mellitus without complication ([place])   HTN (hypertension)   Homeless   Fall at home, initial encounter   Elevated troponin   Hypercalcemia   Chronic back pain   SIRS (systemic inflammatory response syndrome) ([place])   Depression  Acute pulmonary embolism ([facility])   AKI (acute kidney injury) ([place]): Likely due to dehydration and continuation of diuretics and Cozaar.  -Please MedSurg bed for observation -hold Bumex, HCTZ, Cozaar -IV fluid: 2 L LR, followed by 75 cc/h  Dementia [facility]): Patient is calm, no behavior disturbance -Observe closely  Diet controlled diabetes mellitus without complication ([place]): No [id number] available, blood sugar 132.  Patient is not taking medications currently -Check A1c -Monitor blood sugar, check CBG qAM  HTN (hypertension) -IV hydralazine as needed -Hold Bumex, HCTZ, Cozaar -ContinueAmlodipine, Coreg  Homeless -TOC consult  Fall at home, initial encounter: Patient refused CT of head and neck -Fall precaution -PT/OT  Elevated troponin: No chest pain.  Troponin level 20, 20, 17, already normalized -Check A1c, FLP -Continue Lipitor  SIRS (systemic inflammatory response syndrome) [facility]): Patient meets criteria for SIRS with WBC 14.0, heart rate 95, RR 28, no source of infection identified.  Procalcitonin 0.13.  No fever, will hold antibiotics now -Follow-up blood culture -Follow-up urinalysis -will get Procalcitonin and trend lactic acid levels per sepsis protocol. -IVF: 2L of LR bolus in ED, followed by 75 cc/h    Hypercalcemia: Calcium 10.4, minimally elevated, likely due to dehydration -IVF as above  Chronic back pain -prn Percocet, Tylenol -Continue home Neurontin  Depression -Seroquel  Addendum: CT of chest incidentally showed PE without evidence of right heart strain. -IV heparin -f/u LE venous Doppler   CT-chest showed: Note is made of incidental pulmonary embolism particularly in the left lower lobe. No evidence of right heart strain is noted.  Dilatation of the ascending aorta to 4.5 cm with normal tapering distally. Ascending thoracic aortic aneurysm. Recommend semi-annual imaging followup by CTA or MRA and referral to cardiothoracic surgery if not already obtained. This recommendation follows 2010 ACCF/AHA/AATS/ACR/ASA/SCA/SCAI/SIR/STS/SVM Guidelines for the Diagnosis and Management of Patients With Thoracic Aortic Disease. Circulation. 2010; [id number]. Aortic aneurysm NOS (ICD10-I71.9)  Right apical abnormality on prior chest x-ray is related to tortuous vascularity and mild mediastinal lipomatosis as well as patient rotation on the prior exam.  Aortic Atherosclerosis (ICD10-I70.0)      DVT ppx:  SQ Lovenox --> IV heparin Code Status: Full code Family Communication: I called her sister who did not pick up the phone, [phone].  I left a message for her.  Disposition Plan: to be determined Consults called:  none Admission status and Level of care: Med-Surg:      Status is: Observation  The patient remains OBS appropriate and will d/c before 2 midnights.         Date of Service 04/18/2021    [facility]   If 7PM-7AM, please contact night-coverage [email or web address] 04/18/2021, 5:25 PM

## 2021-04-18 NOTE — ED Notes (Signed)
Lab and IV team in room

## 2021-04-19 DIAGNOSIS — N179 Acute kidney failure, unspecified: Secondary | ICD-10-CM | POA: Diagnosis not present

## 2021-04-19 LAB — BASIC METABOLIC PANEL
Anion gap: 7 (ref 5–15)
BUN: 25 mg/dL — ABNORMAL HIGH (ref 8–23)
CO2: 29 mmol/L (ref 22–32)
Calcium: 9.9 mg/dL (ref 8.9–10.3)
Chloride: 104 mmol/L (ref 98–111)
Creatinine, Ser: 1.05 mg/dL (ref 0.61–1.24)
GFR, Estimated: >60 mL/min (ref >60)
Glucose, Bld: 136 mg/dL — ABNORMAL HIGH (ref 70–99)
Potassium: 2.8 mmol/L — ABNORMAL LOW (ref 3.5–5.1)
Sodium: 140 mmol/L (ref 135–145)

## 2021-04-19 LAB — LIPID PANEL
Cholesterol: 124 mg/dL (ref 0–200)
HDL: 40 mg/dL — ABNORMAL LOW (ref >40)
LDL Cholesterol: 53 mg/dL (ref 0–99)
Total CHOL/HDL Ratio: 3.1 RATIO
Triglycerides: 154 mg/dL — ABNORMAL HIGH (ref <150)
VLDL: 31 mg/dL (ref 0–40)

## 2021-04-19 LAB — CBC
HCT: 39.4 % (ref 39.0–52.0)
Hemoglobin: 14.3 g/dL (ref 13.0–17.0)
MCH: 32.7 pg (ref 26.0–34.0)
MCHC: 36.3 g/dL — ABNORMAL HIGH (ref 30.0–36.0)
MCV: 90.2 fL (ref 80.0–100.0)
Platelets: 179 10*3/uL (ref 150–400)
RBC: 4.37 MIL/uL (ref 4.22–5.81)
RDW: 13.7 % (ref 11.5–15.5)
WBC: 8.9 10*3/uL (ref 4.0–10.5)
nRBC: 0 % (ref 0.0–0.2)

## 2021-04-19 LAB — CBG MONITORING, ED: Glucose-Capillary: 130 mg/dL — ABNORMAL HIGH (ref 70–99)

## 2021-04-19 LAB — HEMOGLOBIN A1C
Hgb A1c MFr Bld: 5.8 % — ABNORMAL HIGH (ref 4.8–5.6)
Mean Plasma Glucose: 120 mg/dL

## 2021-04-19 LAB — HEPARIN LEVEL (UNFRACTIONATED): Heparin Unfractionated: 0.88 IU/mL — ABNORMAL HIGH (ref 0.30–0.70)

## 2021-04-19 MED ORDER — APIXABAN 5 MG PO TABS
10.0000 mg | ORAL_TABLET | Freq: Two times a day (BID) | ORAL | Status: AC
  Administered 2021-04-19 – 2021-04-25 (×14): 10 mg via ORAL
  Filled 2021-04-19 (×15): qty 2

## 2021-04-19 MED ORDER — APIXABAN 5 MG PO TABS
5.0000 mg | ORAL_TABLET | Freq: Two times a day (BID) | ORAL | Status: DC
  Administered 2021-04-26 – 2021-05-01 (×11): 5 mg via ORAL
  Filled 2021-04-19 (×11): qty 1

## 2021-04-19 MED ORDER — POTASSIUM CHLORIDE 20 MEQ PO PACK
40.0000 meq | PACK | Freq: Three times a day (TID) | ORAL | Status: DC
  Administered 2021-04-19 – 2021-04-21 (×9): 40 meq via ORAL
  Filled 2021-04-19 (×9): qty 2

## 2021-04-19 NOTE — Progress Notes (Signed)
OT Cancellation Note  Patient Details Name: Michael Alvarado MRN: [id number] DOB: 08/18/1955   Cancelled Treatment:    Reason Eval/Treat Not Completed: Medical issues which prohibited therapy. OT orders received, chart reviewed. Pt noted to have PE & RLE DVT. Pt is currently on anticoagulation but has only completed 16 hrs at this point. Will hold OT evaluation & f/u tomorrow once pt has been on anticoagulation longer. MD made aware.   Arman Filter., MPH, MS, OTR/L ascom [phone] 04/19/21, 10:10 AM

## 2021-04-19 NOTE — Progress Notes (Signed)
ANTICOAGULATION CONSULT NOTE  Pharmacy Consult for Apixaban Indication: pulmonary embolus  No Known Allergies  Patient Measurements: Height: 5\' 9"  (175.3 cm) Weight: 114.8 kg (253 lb) IBW/kg (Calculated) : 70.7 Heparin Dosing Weight: 96.3 kg  Vital Signs: BP: 106/70 (11/18 0400) Pulse Rate: 56 (11/18 0400)  Labs: Recent Labs    04/18/21 0130 04/18/21 0450 04/18/21 1114 04/18/21 1159 04/18/21 1753 04/19/21 0200 04/19/21 0734  HGB 17.5*  --   --   --   --   --  14.3  HCT 48.0  --   --   --   --   --  39.4  PLT 257  --   --   --   --   --  179  APTT  --   --   --   --  31  --   --   LABPROT  --   --   --   --  14.7  --   --   INR  --   --   --   --  1.2  --   --   HEPARINUNFRC  --   --   --   --   --  0.88*  --   CREATININE 1.95* 1.59*  --  1.56*  --   --  1.05  CKTOTAL 393  --   --   --   --   --   --   TROPONINIHS 20* 20* 17  --   --   --   --      Estimated Creatinine Clearance: 85.3 mL/min (by C-G formula based on SCr of 1.05 mg/dL).   Medical History: Past Medical History:  Diagnosis Date   Dementia (HCC)    Diabetes mellitus without complication ([facility])    Hypertension     Medications:  No anticoagulation noted PTA  Assessment: 65 y.o. male with medical history significant for dementia, hypertension, hyperlipidemia, diabetes mellitus, GERD, depression, chronic back pain, who presented with fall. Pharmacy has been consulted to transition patient from heparin to apixaban dosing.  Baseline labs: Hgb 17.5, Hct/Plt wnl; aPTT 31 sec, PT-INR 1.2  Goal of Therapy:  Heparin level 0.3-0.7 units/ml Monitor platelets by anticoagulation protocol: Yes   Plan:  Will stop heparin infusion and order Apixaban 10mg  PO BID x 7 days, then 5mg  PO BID thereafter. Hgb and Plts stable currently. Continue to monitor H&H and platelets  Daaron Dimarco A Kashara Blocher 04/19/2021,9:00 AM

## 2021-04-19 NOTE — Progress Notes (Signed)
PT Cancellation Note  Patient Details Name: Michael Alvarado MRN: [id number] DOB: 1956-04-16   Cancelled Treatment:    Reason Eval/Treat Not Completed: Other (comment) PT orders received, chart reviewed. Pt noted to have PE & RLE DVT. Pt is currently on anticoagulation but has only completed 16 hrs at this point. Will hold PT evaluation & f/u tomorrow once pt has been on anticoagulation longer. MD made aware.   Aleda Grana, PT, DPT 04/19/21, 10:07 AM    Sandi Mariscal 04/19/2021, 10:06 AM

## 2021-04-19 NOTE — Progress Notes (Signed)
PROGRESS NOTE    MARQUICE UDDIN  ZOX:[id number] DOB: Jun 13, 1955 DOA: 04/18/2021 PCP: Danella Penton, MD   Brief Narrative:  HPI: Michael Alvarado is a 65 y.o. male with medical history significant of homeless, dementia, hypertension, hyperlipidemia, diabetes mellitus, GERD, depression, chronic back pain, who presents with fall.    Per report, pt was walking across [place] to get "somewhere."  He says he was walking so long that he just got dizzy, tired and fell.  He denies any significant injury.  No headache or neck pain.  No loss of consciousness.  He refused CT scan of head and neck.  He has bruise in both knees.  He states that he had nausea and vomited twice earlier, which has resolved.  Currently patient denies nausea, vomiting, diarrhea or abdominal pain.  Denies symptoms of UTI.  Patient states that he has chronic shortness of breath which has not changed.  He also reports mild dry cough, no chest pain.  No fever or chills. He has a history of chronic back pain, which has been persistent, without significant change recently.     ED Course: pt was found to have AKI with creatinine 1.56, BUN 33 (baseline creatinine 0.83 on 04/30/2020), WBC 14.0, troponin level 20, 20, 17, negative COVID PCR, CK level 393, alcohol level less than 10, temperature normal, blood thinning blood pressure 140/113, heart rate 95, 86, RR 28, oxygen saturation 94-97% on room air.  Chest x-ray showed a right apical density.   CXR showed: Right apical density is now noted. CT scan of the chest with intravenous contrast is recommended to rule out nodule or mass  Assessment & Plan:   Principal Problem:   AKI (acute kidney injury) (HCC) Active Problems:   Dementia (HCC)   Diabetes mellitus without complication (HCC)   HTN (hypertension)   Homeless   Fall at home, initial encounter   Elevated troponin   Hypercalcemia   Chronic back pain   SIRS (systemic inflammatory response syndrome) (HCC)   Depression   Acute  pulmonary embolism (HCC)  Syncope/fall: I was able to gather more history from patient, he tells me that he felt dizzy prior to falling.  He clearly tells me that it was not a mechanical fall.  He passed out.  Likely secondary to PE which was incidentally found later on.  I have consulted PT OT to assess him.  Acute PE/Right lower extremity DVT: Incidental finding.  He was started on heparin drip.  No heart strain on the CT angiogram of the chest.  He is completely stable.  We will transition him to Eliquis.  This is likely the cause of his syncope.  AKI (acute kidney injury) (HCC): Likely due to dehydration and continuation of diuretics and Cozaar.  Received IV fluids, currently resolved.  Continue to hold nephrotoxic agents which includes Bumex, HCTZ and Cozaar.  Consider resuming tomorrow.   Dementia [facility]): Patient is calm, no behavior disturbance.  Seems to have a good memory.  Hypokalemia: 2.8.  Will replace.   Prediabetes: Recent hemoglobin A1c only 5.9.  Hemoglobin A1c pending.    Essential hypertension: Presented with very high blood pressure but did have very low blood pressure earlier this morning.  Currently within normal range.  Continue amlodipine and Coreg but continue to hold Bumex, HCTZ and Cozaar.  Homelessness: TOC consulted.   Elevated troponin: Very mild elevation and flat.  No chest pain.  Likely secondary to PE.   SIRS (systemic inflammatory response syndrome) (HCC):  Patient meets criteria for SIRS with WBC 14.0, heart rate 95, RR 28, no source of infection identified.  Likely secondary to combination of dehydration and PE.  Procalcitonin 0.13.  No antibiotics indicated.   Hypercalcemia: Resolved.   Chronic back pain -prn Percocet, Tylenol -Continue home Neurontin   Depression -Seroquel  Multiple abrasions/scratch marks around bilateral knee secondary to fall: Noted.  No signs of infection.  DVT prophylaxis:    Code Status: Full Code  Family Communication:   None present at bedside.  Plan of care discussed with patient in length and he verbalized understanding and agreed with it.  Status is: Observation  The patient will require care spanning > 2 midnights and should be moved to inpatient because: Needs monitoring for severe hypokalemia and needs to be evaluated by PT OT.  Estimated body mass index is 37.36 kg/m as calculated from the following:   Height as of this encounter:  (1.753 m).   Weight as of this encounter: 114.8 kg.     Nutritional Assessment: Body mass index is 37.36 kg/m.Marland Kitchen Seen by dietician.  I agree with the assessment and plan as outlined below: Nutrition Status:  Skin Assessment: I have examined the patient's skin and I agree with the wound assessment as performed by the wound care RN as outlined below:    Consultants:  None  Procedures:  None  Antimicrobials:  Anti-infectives (From admission, onward)    None          Subjective: Patient seen and examined.  He feels better.  No complaints.  Just generalized weakness.  Objective: Vitals:   04/19/21 0200 04/19/21 0300 04/19/21 0400 04/19/21 0901  BP: (!) 88/68 95/65 106/70 131/83  Pulse: 60 64 (!) 56 63  Resp: Temp:      TempSrc:      SpO2: 91% (!) 89% 91% 99%  Weight:      Height:        Intake/Output Summary (Last 24 hours) at 04/19/2021 1147 Last data filed at 04/18/2021 1821 Gross per 24 hour  Intake --  Output 100 ml  Net -100 ml   Filed Weights   04/18/21 1216  Weight: 114.8 kg    Examination:  General exam: Appears calm and comfortable, obese Respiratory system: Clear to auscultation. Respiratory effort normal. Cardiovascular system: S1 & S2 heard, RRR. No JVD, murmurs, rubs, gallops or clicks. No pedal edema. Gastrointestinal system: Abdomen is nondistended, soft and nontender. No organomegaly or masses felt. Normal bowel sounds heard. Central nervous system: Alert and oriented. No focal neurological  deficits. Extremities: Symmetric 5 x 5 power. Skin: Multiple scratch marks/abrasions at bilateral knees. Psychiatry: Judgement and insight appear normal. Mood & affect appropriate.    Data Reviewed: I have personally reviewed following labs and imaging studies  CBC: Recent Labs  Lab 04/18/21 0130 04/19/21 0734  WBC 14.0* 8.9  NEUTROABS 10.9*  --   HGB 17.5* 14.3  HCT 48.0 39.4  MCV 90.6 90.2  PLT 257 179   Basic Metabolic Panel: Recent Labs  Lab 04/18/21 0130 04/18/21 0450 04/18/21 1159 04/19/21 0734  NA 137 136 135 140  K 2.9* 3.0* 3.5 2.8*  CL 102 101 98 104  CO2 20* GLUCOSE 166* 152* 132* 136*  BUN 32* 34* 33* 25*  CREATININE 1.95* 1.59* 1.56* 1.05  CALCIUM 10.2 10.5* 10.4* 9.9   GFR: Estimated Creatinine Clearance: 85.3 mL/min (by C-G formula based on SCr of 1.05 mg/dL).  Liver Function Tests: Recent Labs  Lab 04/18/21 0450  AST 24  ALT 29  ALKPHOS 87  BILITOT 1.3*  PROT 7.5  ALBUMIN 4.1   No results for input(s): LIPASE, AMYLASE in the last 168 hours. No results for input(s): AMMONIA in the last 168 hours. Coagulation Profile: Recent Labs  Lab 04/18/21 1753  INR 1.2   Cardiac Enzymes: Recent Labs  Lab 04/18/21 0130  CKTOTAL 393   BNP (last 3 results) No results for input(s): PROBNP in the last 8760 hours. HbA1C: No results for input(s): HGBA1C in the last 72 hours. CBG: Recent Labs  Lab 04/19/21 0900  GLUCAP 130*   Lipid Profile: Recent Labs    04/19/21 0734  CHOL 124  HDL 40*  LDLCALC 53  TRIG 782*  CHOLHDL 3.1   Thyroid Function Tests: No results for input(s): TSH, T4TOTAL, FREET4, T3FREE, THYROIDAB in the last 72 hours. Anemia Panel: No results for input(s): VITAMINB12, FOLATE, FERRITIN, TIBC, IRON, RETICCTPCT in the last 72 hours. Sepsis Labs: Recent Labs  Lab 04/18/21 1114 04/18/21 1413  PROCALCITON 0.13  --   LATICACIDVEN  --  1.8    Recent Results (from the past 240 hour(s))  Resp Panel by RT-PCR  (Flu A&B, Covid) Nasopharyngeal Swab     Status: None   Collection Time: 04/18/21 11:14 AM   Specimen: Nasopharyngeal Swab; Nasopharyngeal(NP) swabs in vial transport medium  Result Value Ref Range Status   SARS Coronavirus 2 by RT PCR NEGATIVE NEGATIVE Final    Comment: (NOTE) SARS-CoV-2 target nucleic acids are NOT DETECTED.  The SARS-CoV-2 RNA is generally detectable in upper respiratory specimens during the acute phase of infection. The lowest concentration of SARS-CoV-2 viral copies this assay can detect is 138 copies/mL. A negative result does not preclude SARS-Cov-2 infection and should not be used as the sole basis for treatment or other patient management decisions. A negative result may occur with  improper specimen collection/handling, submission of specimen other than nasopharyngeal swab, presence of viral mutation(s) within the areas targeted by this assay, and inadequate number of viral copies(<138 copies/mL). A negative result must be combined with clinical observations, patient history, and epidemiological information. The expected result is Negative.  Fact Sheet for Patients:  BloggerCourse.com  Fact Sheet for Healthcare Providers:  SeriousBroker.it  This test is no t yet approved or cleared by the [place] FDA and  has been authorized for detection and/or diagnosis of SARS-CoV-2 by FDA under an Emergency Use Authorization (EUA). This EUA will remain  in effect (meaning this test can be used) for the duration of the COVID-19 declaration under Section 564(b)(1) of the Act, 21 U.S.C.section 360bbb-3(b)(1), unless the authorization is terminated  or revoked sooner.       Influenza A by PCR NEGATIVE NEGATIVE Final   Influenza B by PCR NEGATIVE NEGATIVE Final    Comment: (NOTE) The Xpert Xpress SARS-CoV-2/FLU/RSV plus assay is intended as an aid in the diagnosis of influenza from Nasopharyngeal swab specimens  and should not be used as a sole basis for treatment. Nasal washings and aspirates are unacceptable for Xpert Xpress SARS-CoV-2/FLU/RSV testing.  Fact Sheet for Patients: BloggerCourse.com  Fact Sheet for Healthcare Providers: SeriousBroker.it  This test is not yet approved or cleared by the [place] FDA and has been authorized for detection and/or diagnosis of SARS-CoV-2 by FDA under an Emergency Use Authorization (EUA). This EUA will remain in effect (meaning this test can be used) for the duration of the COVID-19 declaration under  Section 564(b)(1) of the Act, 21 U.S.C. section 360bbb-3(b)(1), unless the authorization is terminated or revoked.  Performed at [facility], [address] [place], [place] [address]   Culture, blood (x 2)     Status: None (Preliminary result)   Collection Time: 04/18/21  2:13 PM   Specimen: BLOOD  Result Value Ref Range Status   Specimen Description BLOOD RIGHT ANTECUBITAL  Final   Special Requests   Final    BOTTLES DRAWN AEROBIC AND ANAEROBIC Blood Culture adequate volume   Culture   Final    NO GROWTH < 24 HOURS Performed at [facility], [address] [place], [place] [address]    Report Status PENDING  Incomplete  Culture, blood (x 2)     Status: None (Preliminary result)   Collection Time: 04/18/21  2:55 PM   Specimen: BLOOD  Result Value Ref Range Status   Specimen Description BLOOD BLOOD RIGHT ARM  Final   Special Requests   Final    BOTTLES DRAWN AEROBIC AND ANAEROBIC Blood Culture adequate volume   Culture   Final    NO GROWTH < 24 HOURS Performed at [facility], [address] [place], [place] [address]    Report Status PENDING  Incomplete      Radiology Studies: CT Chest W Contrast  Result Date: 04/18/2021 CLINICAL DATA:  Recent syncopal episode with abnormal chest x-ray, initial encounter EXAM: CT CHEST WITH CONTRAST TECHNIQUE:  Multidetector CT imaging of the chest was performed during intravenous contrast administration. CONTRAST:  54mL OMNIPAQUE IOHEXOL 300 MG/ML  SOLN COMPARISON:  Chest x-ray from earlier in the same day. FINDINGS: Cardiovascular: Thoracic aorta and its branches demonstrate atherosclerotic calcification. Mild dilatation of the ascending aorta to 4.5 cm is noted. Normal tapering is seen distally in the descending thoracic aorta. Diffuse coronary calcifications are seen. No cardiac enlargement is noted. The pulmonary artery is not timed for embolus evaluation although multiple filling defects are identified most prominent in the left lower lobe but to a lesser degree in the right lower lobe consistent with pulmonary emboli. No evidence of right heart strain is seen. Mediastinum/Nodes: Thoracic inlet is within normal limits. No sizable hilar or mediastinal adenopathy is noted. Mild mediastinal fatty prominence is noted. The esophagus as visualized is within normal limits. Lungs/Pleura: Lungs are well aerated bilaterally. Minimal atelectatic changes are noted in the bases. No sizable infiltrate or parenchymal nodule is noted. The area of abnormality on prior chest x-ray is related to the prominent mediastinal fat and tortuous vascularity. Patient rotation to the right also accentuated these changes on the prior plain film. 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 Fatty infiltration of the liver is seen. A small renal cyst is noted on the right. Musculoskeletal: Degenerative changes of the thoracic spine are noted. No acute rib abnormality is seen. No compression deformities are noted. IMPRESSION: Note is made of incidental pulmonary embolism particularly in the left lower lobe. No evidence of right heart strain is noted. Dilatation of the ascending aorta to 4.5 cm with normal tapering distally. Ascending thoracic aortic aneurysm. Recommend  semi-annual imaging followup by CTA or MRA and referral to cardiothoracic surgery if not already obtained. This recommendation follows 2010 ACCF/AHA/AATS/ACR/ASA/SCA/SCAI/SIR/STS/SVM Guidelines for the Diagnosis and Management of Patients With Thoracic Aortic Disease. Circulation. 2010; 121: T557-D220. Aortic aneurysm NOS (ICD10-I71.9) Right apical abnormality on prior chest x-ray is related to tortuous vascularity and mild mediastinal lipomatosis  as well as patient rotation on the prior exam. Aortic Atherosclerosis (ICD10-I70.0). Critical Value/emergent results were called by telephone at the time of interpretation on 04/18/2021 at 5:16 pm to Dr. Clyde Lundborg, who verbally acknowledged these results. Electronically Signed   By: Alcide Clever M.D.   On: 04/18/2021 17:19   US Venous Img Lower Bilateral (DVT)  Result Date: 04/18/2021 CLINICAL DATA:  Acute pulmonary embolism. EXAM: BILATERAL LOWER EXTREMITY VENOUS DOPPLER ULTRASOUND TECHNIQUE: Gray-scale sonography with graded compression, as well as color Doppler and duplex ultrasound were performed to evaluate the lower extremity deep venous systems from the level of the common femoral vein and including the common femoral, femoral, profunda femoral, popliteal and calf veins including the posterior tibial, peroneal and gastrocnemius veins when visible. The superficial great saphenous vein was also interrogated. Spectral Doppler was utilized to evaluate flow at rest and with distal augmentation maneuvers in the common femoral, femoral and popliteal veins. COMPARISON:  None. FINDINGS: RIGHT LOWER EXTREMITY Common Femoral Vein: No evidence of thrombus. Normal compressibility, respiratory phasicity and response to augmentation. Saphenofemoral Junction: No evidence of thrombus. Normal compressibility and flow on color Doppler imaging. Profunda Femoral Vein: No evidence of thrombus. Normal compressibility and flow on color Doppler imaging. Femoral Vein: Evidence of nonocclusive  thrombus with abnormal compressibility, respiratory phasicity and response to augmentation. Popliteal Vein: Evidence of nonocclusive thrombus with abnormal compressibility, respiratory phasicity and response to augmentation. Calf Veins: The RIGHT posterior tibial vein is not visualized. No evidence of thrombus within the RIGHT peroneal vein with normal compressibility and normal flow on color Doppler imaging. Superficial Great Saphenous Vein: No evidence of thrombus. Normal compressibility. Venous Reflux:  None. Other Findings:  None. LEFT LOWER EXTREMITY Common Femoral Vein: No evidence of thrombus. Normal compressibility, respiratory phasicity and response to augmentation. Saphenofemoral Junction: No evidence of thrombus. Normal compressibility and flow on color Doppler imaging. Profunda Femoral Vein: No evidence of thrombus. Normal compressibility and flow on color Doppler imaging. Femoral Vein: No evidence of thrombus. Normal compressibility, respiratory phasicity and response to augmentation. Popliteal Vein: No evidence of thrombus. Normal compressibility, respiratory phasicity and response to augmentation. Calf Veins: No evidence of thrombus. Normal compressibility and flow on color Doppler imaging. Superficial Great Saphenous Vein: No evidence of thrombus. Normal compressibility. Venous Reflux:  None. Other Findings:  None. IMPRESSION: 1. Nonocclusive DVT within the RIGHT femoral and RIGHT popliteal veins. 2. No evidence of DVT within the LEFT lower extremity. Electronically Signed   By: Aram Candela M.D.   On: 04/18/2021 21:37   DG Chest Portable 1 View  Result Date: 04/18/2021 CLINICAL DATA:  Cough, weakness. EXAM: PORTABLE CHEST 1 VIEW COMPARISON:  April 29, 2020. FINDINGS: Stable cardiomediastinal silhouette. Left lung is clear. Right apical density is now noted, although it may be due to the patient being rotated. Possible mass cannot be excluded. 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 Bony thorax is unremarkable. IMPRESSION: Right apical density is now noted. CT scan of the chest with intravenous contrast is recommended to rule out nodule or mass. Electronically Signed   By: Lupita Raider M.D.   On: 04/18/2021 11:15    Scheduled Meds:  amLODipine  5 mg Oral Daily   apixaban  10 mg Oral BID   Followed by   Melene Muller ON 04/26/2021] apixaban  5 mg Oral BID   atorvastatin  10 mg Oral QHS   carvedilol  25 mg Oral BID   gabapentin  300 mg Oral TID   potassium chloride  40 mEq Oral TID   QUEtiapine  300 mg Oral QHS   Continuous Infusions:  lactated ringers       LOS: 0 days   Time spent: 37 minutes   Hughie Closs, MD [facility]  04/19/2021, 11:47 AM  Please page via Loretha Stapler and do not message via secure chat for anything urgent. Secure chat can be used for anything non urgent.  How to contact the [facility] Attending or Consulting provider 7A - 7P or covering provider during after hours 7P -7A, for this patient?  Check the care team in [facility] and look for a) attending/consulting [facility] provider listed and b) the [facility] team listed. Page or secure chat 7A-7P. Log into [email or web address] and use [place]'s universal password to access. If you do not have the password, please contact the hospital operator. Locate the [facility] provider you are looking for under [facility] and page to a number that you can be directly reached. If you still have difficulty reaching the provider, please page the [facility] (Director on Call) for the Hospitalists listed on amion for assistance.

## 2021-04-19 NOTE — Progress Notes (Signed)
ANTICOAGULATION CONSULT NOTE  Pharmacy Consult for heparin infusion Indication: pulmonary embolus  No Known Allergies  Patient Measurements: Height: 5\' 9"  (175.3 cm) Weight: 114.8 kg (253 lb) IBW/kg (Calculated) : 70.7 Heparin Dosing Weight: 96.3 kg  Vital Signs: BP: 99/69 (11/18 0030) Pulse Rate: 64 (11/18 0030)  Labs: Recent Labs    04/18/21 0130 04/18/21 0450 04/18/21 1114 04/18/21 1159 04/18/21 1753 04/19/21 0200  HGB 17.5*  --   --   --   --   --   HCT 48.0  --   --   --   --   --   PLT 257  --   --   --   --   --   APTT  --   --   --   --  31  --   LABPROT  --   --   --   --  14.7  --   INR  --   --   --   --  1.2  --   HEPARINUNFRC  --   --   --   --   --  0.88*  CREATININE 1.95* 1.59*  --  1.56*  --   --   CKTOTAL 393  --   --   --   --   --   TROPONINIHS 20* 20* 17  --   --   --      Estimated Creatinine Clearance: 57.4 mL/min (A) (by C-G formula based on SCr of 1.56 mg/dL (H)).   Medical History: Past Medical History:  Diagnosis Date   Dementia (HCC)    Diabetes mellitus without complication (HCC)    Hypertension     Medications:  No anticoagulation noted PTA  Assessment: 65 y.o. male with medical history significant for dementia, hypertension, hyperlipidemia, diabetes mellitus, GERD, depression, chronic back pain, who presented with fall. Pharmacy has been consulted for heparin dosing and monitoring.   Baseline labs: Hgb 17.5, Hct/Plt wnl; aPTT, PT-INR pending  Goal of Therapy:  Heparin level 0.3-0.7 units/ml Monitor platelets by anticoagulation protocol: Yes  11/18 0200 HL 0.88, supratherapeutic   Plan:  Decrease heparin infusion to 1450 units/hr Recheck HL in 6 hr and daily while on heparin Continue to monitor H&H and platelets  12/18, PharmD, [facility] 04/19/2021 3:01 AM

## 2021-04-19 NOTE — ED Notes (Signed)
CBG 130 

## 2021-04-19 NOTE — Progress Notes (Signed)
Rate dose change on heparin verified by Jeannett Senior.

## 2021-04-20 DIAGNOSIS — N179 Acute kidney failure, unspecified: Secondary | ICD-10-CM | POA: Diagnosis not present

## 2021-04-20 LAB — BASIC METABOLIC PANEL
Anion gap: 6 (ref 5–15)
BUN: 17 mg/dL (ref 8–23)
CO2: 28 mmol/L (ref 22–32)
Calcium: 10 mg/dL (ref 8.9–10.3)
Chloride: 107 mmol/L (ref 98–111)
Creatinine, Ser: 0.76 mg/dL (ref 0.61–1.24)
GFR, Estimated: >60 mL/min (ref >60)
Glucose, Bld: 105 mg/dL — ABNORMAL HIGH (ref 70–99)
Potassium: 3.5 mmol/L (ref 3.5–5.1)
Sodium: 141 mmol/L (ref 135–145)

## 2021-04-20 LAB — GLUCOSE, CAPILLARY: Glucose-Capillary: 96 mg/dL (ref 70–99)

## 2021-04-20 NOTE — Evaluation (Signed)
Occupational Therapy Evaluation Patient Details Name: Michael Alvarado MRN: [id number] DOB: [birth year] Today's Date: 04/20/2021   History of Present Illness Pt is a 65 y/o M admitted on 04/18/21 with c/c of fall. He refused CT scan of head and neck. Pt also had incidental findings of acute PE & RLE DVT, likely his cause of syncope leading to fall. PMH: dementia, HTN, HLD, DM, GERD, depression, chronic back pain   Clinical Impression   MD cleared pt for participation in OT for this afternoon.  Upon arrival of OT - pt found sitting up in recliner with chair alarm in his hands asking that it is going off and if I can help him. Pt with impaired cognition and reporting he do not wear socks - only shoes and shorts. Pt happy to be assist by OT back to bed, where it will be warmer and he can watch tv. Functional mobility in room S using furniture for balance, sitting EOB Independent in upper body ADL's, standing at sink Supervision washing hands, bed mobility  sit to supine Supervision. LB ADL's sitting EOB  MIn A. Pt with poor safety awareness & impaired  balance in standing and functional mobility - pt can continue to benefit from skilled OT services to increase balance in ADL's and safety to bathroom functional mobility.          Recommendations for follow up therapy are one component of a multi-disciplinary discharge planning process, led by the attending physician.  Recommendations may be updated based on patient status, additional functional criteria and insurance authorization.   Follow Up Recommendations    ? Homeless - do not know home situation    Assistance Recommended at Discharge Intermittent Supervision/Assistance  Functional Status Assessment     Equipment Recommendations       Recommendations for Other Services       Precautions / Restrictions Precautions Precautions: Fall Precaution Comments: back precautions for comfort Restrictions Weight Bearing Restrictions: No       Mobility Bed Mobility Overal bed mobility: Needs Assistance Bed Mobility: Supine to Sit;Sit to Supine     Supine to sit: HOB elevated;Min assist Sit to supine: Supervision   General bed mobility comments: Pt holds to PT's hand to pull himself towards EOB.    Transfers Overall transfer level: Needs assistance Equipment used: None Transfers: Bed to chair/wheelchair/BSC Sit to Stand: Min guard Stand pivot transfers: Min assist (no AD)         General transfer comment: use furniture in room      Balance Overall balance assessment: Needs assistance Sitting-balance support: No upper extremity supported Sitting balance-Leahy Scale: Normal     Standing balance support: No upper extremity supported Standing balance-Leahy Scale: Fair Standing balance comment: Pt able to maintain static  and dynamic standingwith  BUE support on furniture in room .                           ADL either performed or assessed with clinical judgement   ADL                                         General ADL Comments: BSC transfer pt hold on the bed rail -To get into bed - he holds on the end of bed and counter - no LOB - sitting Ind -  good no upper arm  support- UB dressing I with setup , LB ADL's MInA     Vision Baseline Vision/History: 1 Wears glasses Patient Visual Report: No change from baseline       Perception     Praxis      Pertinent Vitals/Pain Pain Assessment: Faces Faces Pain Scale: Hurts even more Pain Location: no pain - report he had pain medication Pain Descriptors / Indicators: Discomfort;Throbbing;Grimacing Pain Intervention(s): Limited activity within patient's tolerance     Hand Dominance Left   Extremity/Trunk Assessment Upper Extremity Assessment Upper Extremity Assessment: Overall WFL for tasks assessed   Lower Extremity Assessment Lower Extremity Assessment: Generalized weakness   Cervical / Trunk Assessment Cervical / Trunk  Assessment: Kyphotic   Communication Communication Communication: No difficulties   Cognition Arousal/Alertness: Awake/alert Behavior During Therapy: Flat affect Overall Cognitive Status: No family/caregiver present to determine baseline cognitive functioning                                 General Comments: Pt 1/2 ways out of recliner with alarm pad in his hand - handing it to me - did remember working with PT before but don't know what he done - wants door and windows close - " otherwise other people can see him "     General Comments  Pt with continent void standing in bathroom with close supervision<>CGA.    Exercises     Shoulder Instructions      Home Living Family/patient expects to be discharged to:: Unsure                                 Additional Comments: Pt was homeless - do not wear socks per pt - only shoes and shorts      Prior Functioning/Environment               Mobility Comments: Pt reports being independent in bathing and dressing          OT Problem List: Decreased strength;Decreased activity tolerance;Impaired balance (sitting and/or standing)      OT Treatment/Interventions: Self-care/ADL training;Therapeutic exercise;Patient/family education;Therapeutic activities;Balance training    OT Goals(Current goals can be found in the care plan section) Acute Rehab OT Goals Patient Stated Goal: Want to get around OT Goal Formulation: With patient Time For Goal Achievement: 04/27/21 Potential to Achieve Goals: Good  OT Frequency: Min 2X/week   Barriers to D/C:            Co-evaluation              AM-PAC OT "6 Clicks" Daily Activity     Outcome Measure Help from another person eating meals?: A Little Help from another person taking care of personal grooming?: A Little Help from another person toileting, which includes using toliet, bedpan, or urinal?: A Little Help from another person bathing (including  washing, rinsing, drying)?: A Little Help from another person to put on and taking off regular upper body clothing?: A Little Help from another person to put on and taking off regular lower body clothing?: A Little 6 Click Score: 18   End of Session Equipment Utilized During Treatment: Gait belt  Activity Tolerance: Patient tolerated treatment well Patient left: in bed;with bed alarm set  OT Visit Diagnosis: Unsteadiness on feet (R26.81);Repeated falls (R29.6);Muscle weakness (generalized) (M62.81)  Time: 6295-2841 OT Time Calculation (min): 24 min Charges:  OT General Charges $OT Visit: 1 Visit OT Evaluation $OT Eval Low Complexity: 1 Low   Tayden Duran OTR/L,CLT 04/20/2021, 4:24 PM

## 2021-04-20 NOTE — Progress Notes (Signed)
PROGRESS NOTE  Michael Alvarado HKV:[id number] DOB: 05/01/1956 DOA: 04/18/2021 PCP: Danella Penton, MD  HPI/Recap of past 34 hours: 65 year old male with medical history significant for homelessness, dementia, hypertension hyperlipidemia, diabetes mellitus, GERD, depression, chronic back pain, who presented with a fall he stated he was walking and he passed out he was admitted for a fall and inability to walk and then found to have pulmonary embolism and DVT is started on Eliquis  Subjective: Patient seen and examined at bedside, he is complaining of back pain  Assessment/Plan: Principal Problem:   AKI (acute kidney injury) (HCC) Active Problems:   Dementia (HCC)   Diabetes mellitus without complication (HCC)   HTN (hypertension)   Homeless   Fall at home, initial encounter   Elevated troponin   Hypercalcemia   Chronic back pain   SIRS (systemic inflammatory response syndrome) (HCC)   Depression   Acute pulmonary embolism (HCC)  1.  Syncope/fall PT OT to assess  2.  AKI: Likely due to dehydration and diuresis in addition to ARB Resolved with IV fluid rehydration Continue to avoid nephrotoxic medication  3.  Dementia patient does not have any behavior problem  4.  Hypokalemia has been repleted, potassium is now normal  5.  Prediabetes.  His recent hemoglobin A1c is 5.9 Continue to monitor and avoid excessive carbohydrate intake  6.  Homelessness TOC consulted for disposition  7.  Mildly elevated troponin may be due to PE  8.  Acute pulmonary embolism/right lower extremity DVT this was an incidental finding He was initially started on heparin drip He is stable and has been switched to oral Eliquis  Code Status: Full  Severity of Illness: The appropriate patient status for this patient is INPATIENT. Inpatient status is judged to be reasonable and necessary in order to provide the required intensity of service to ensure the patient's safety. The patient's presenting  symptoms, physical exam findings, and initial radiographic and laboratory data in the context of their chronic comorbidities is felt to place them at high risk for further clinical deterioration. Furthermore, it is not anticipated that the patient will be medically stable for discharge from the hospital within 2 midnights of admission.  New diagnosis of pulmonary embolism * I certify that at the point of admission it is my clinical judgment that the patient will require inpatient hospital care spanning beyond 2 midnights from the point of admission due to high intensity of service, high risk for further deterioration and high frequency of surveillance required.*   Family Communication: None at bedside  Disposition Plan: To be determined Status is: Inpatient   Dispo: The patient is from: Home              Anticipated d/c is to:               Anticipated d/c date is:               Patient currently not medically stable for discharge  Consultants: None  Procedures: None  Antimicrobials: None  DVT prophylaxis:  Apixaban   Objective: Vitals:   04/19/21 1439 04/19/21 2019 04/20/21 0519 04/20/21 0835  BP: 121/77 111/66 112/64 136/86  Pulse: 66 64 60 65  Resp: 18 18 18 18   Temp: 97.7 F (36.5 C) 97.8 F (36.6 C) 97.8 F (36.6 C) 98.1 F (36.7 C)  TempSrc:      SpO2: 98% 98% 98% 99%  Weight:      Height:  Intake/Output Summary (Last 24 hours) at 04/20/2021 0845 Last data filed at 04/20/2021 0602 Gross per 24 hour  Intake 814.93 ml  Output 400 ml  Net 414.93 ml   Filed Weights   04/18/21 1216  Weight: 114.8 kg   Body mass index is 37.36 kg/m.  Exam:  General: 65 y.o. year-old male well developed well nourished in no acute distress.  Alert and oriented x3.  Obese Cardiovascular: Regular rate and rhythm with no rubs or gallops.  No thyromegaly or JVD noted.   Respiratory: Clear to auscultation with no wheezes or rales. Good inspiratory effort. Abdomen: Soft  nontender nondistended with normal bowel sounds x4 quadrants. Musculoskeletal: No lower extremity edema. 2/4 pulses in all 4 extremities. Skin: No ulcerative lesions noted or rashes, Psychiatry: Mood is appropriate for condition and setting Neurology:    Data Reviewed: CBC: Recent Labs  Lab 04/18/21 0130 04/19/21 0734  WBC 14.0* 8.9  NEUTROABS 10.9*  --   HGB 17.5* 14.3  HCT 48.0 39.4  MCV 90.6 90.2  PLT 257 179   Basic Metabolic Panel: Recent Labs  Lab 04/18/21 0130 04/18/21 0450 04/18/21 1159 04/19/21 0734 04/20/21 0535  NA 137 136 135 140 141  K 2.9* 3.0* 3.5 2.8* 3.5  CL 102 101 98 104 107  CO2 20* 22 23 29 28   GLUCOSE 166* 152* 132* 136* 105*  BUN 32* 34* 33* 25* 17  CREATININE 1.95* 1.59* 1.56* 1.05 0.76  CALCIUM 10.2 10.5* 10.4* 9.9 10.0   GFR: Estimated Creatinine Clearance: 111.9 mL/min (by C-G formula based on SCr of 0.76 mg/dL). Liver Function Tests: Recent Labs  Lab 04/18/21 0450  AST 24  ALT 29  ALKPHOS 87  BILITOT 1.3*  PROT 7.5  ALBUMIN 4.1   No results for input(s): LIPASE, AMYLASE in the last 168 hours. No results for input(s): AMMONIA in the last 168 hours. Coagulation Profile: Recent Labs  Lab 04/18/21 1753  INR 1.2   Cardiac Enzymes: Recent Labs  Lab 04/18/21 0130  CKTOTAL 393   BNP (last 3 results) No results for input(s): PROBNP in the last 8760 hours. HbA1C: Recent Labs    04/18/21 1114  HGBA1C 5.8*   CBG: Recent Labs  Lab 04/19/21 0900 04/20/21 0833  GLUCAP 130* 96   Lipid Profile: Recent Labs    04/19/21 0734  CHOL 124  HDL 40*  LDLCALC 53  TRIG 04/21/21*  CHOLHDL 3.1   Thyroid Function Tests: No results for input(s): TSH, T4TOTAL, FREET4, T3FREE, THYROIDAB in the last 72 hours. Anemia Panel: No results for input(s): VITAMINB12, FOLATE, FERRITIN, TIBC, IRON, RETICCTPCT in the last 72 hours. Urine analysis:    Component Value Date/Time   COLORURINE YELLOW (A) 04/18/2021 1114   APPEARANCEUR HAZY (A)  04/18/2021 1114   LABSPEC 1.026 04/18/2021 1114   PHURINE 5.0 04/18/2021 1114   GLUCOSEU NEGATIVE 04/18/2021 1114   HGBUR NEGATIVE 04/18/2021 1114   BILIRUBINUR SMALL (A) 04/18/2021 1114   KETONESUR 20 (A) 04/18/2021 1114   PROTEINUR NEGATIVE 04/18/2021 1114   NITRITE NEGATIVE 04/18/2021 1114   LEUKOCYTESUR NEGATIVE 04/18/2021 1114   Sepsis Labs: @LABRCNTIP (procalcitonin:4,lacticidven:4)  ) Recent Results (from the past 240 hour(s))  Resp Panel by RT-PCR (Flu A&B, Covid) Nasopharyngeal Swab     Status: None   Collection Time: 04/18/21 11:14 AM   Specimen: Nasopharyngeal Swab; Nasopharyngeal(NP) swabs in vial transport medium  Result Value Ref Range Status   SARS Coronavirus 2 by RT PCR NEGATIVE NEGATIVE Final    Comment: (NOTE)  SARS-CoV-2 target nucleic acids are NOT DETECTED.  The SARS-CoV-2 RNA is generally detectable in upper respiratory specimens during the acute phase of infection. The lowest concentration of SARS-CoV-2 viral copies this assay can detect is 138 copies/mL. A negative result does not preclude SARS-Cov-2 infection and should not be used as the sole basis for treatment or other patient management decisions. A negative result may occur with  improper specimen collection/handling, submission of specimen other than nasopharyngeal swab, presence of viral mutation(s) within the areas targeted by this assay, and inadequate number of viral copies(<138 copies/mL). A negative result must be combined with clinical observations, patient history, and epidemiological information. The expected result is Negative.  Fact Sheet for Patients:  BloggerCourse.com  Fact Sheet for Healthcare Providers:  SeriousBroker.it  This test is no t yet approved or cleared by the [place] FDA and  has been authorized for detection and/or diagnosis of SARS-CoV-2 by FDA under an Emergency Use Authorization (EUA). This EUA will remain   in effect (meaning this test can be used) for the duration of the COVID-19 declaration under Section 564(b)(1) of the Act, 21 U.S.C.section 360bbb-3(b)(1), unless the authorization is terminated  or revoked sooner.       Influenza A by PCR NEGATIVE NEGATIVE Final   Influenza B by PCR NEGATIVE NEGATIVE Final    Comment: (NOTE) The Xpert Xpress SARS-CoV-2/FLU/RSV plus assay is intended as an aid in the diagnosis of influenza from Nasopharyngeal swab specimens and should not be used as a sole basis for treatment. Nasal washings and aspirates are unacceptable for Xpert Xpress SARS-CoV-2/FLU/RSV testing.  Fact Sheet for Patients: BloggerCourse.com  Fact Sheet for Healthcare Providers: SeriousBroker.it  This test is not yet approved or cleared by the [place] FDA and has been authorized for detection and/or diagnosis of SARS-CoV-2 by FDA under an Emergency Use Authorization (EUA). This EUA will remain in effect (meaning this test can be used) for the duration of the COVID-19 declaration under Section 564(b)(1) of the Act, 21 U.S.C. section 360bbb-3(b)(1), unless the authorization is terminated or revoked.  Performed at [facility], [address] [place], [place] [address]   Culture, blood (x 2)     Status: None (Preliminary result)   Collection Time: 04/18/21  2:13 PM   Specimen: BLOOD  Result Value Ref Range Status   Specimen Description BLOOD RIGHT ANTECUBITAL  Final   Special Requests   Final    BOTTLES DRAWN AEROBIC AND ANAEROBIC Blood Culture adequate volume   Culture   Final    NO GROWTH 2 DAYS Performed at [facility], [address], [place], [place] [address]    Report Status PENDING  Incomplete  Culture, blood (x 2)     Status: None (Preliminary result)   Collection Time: 04/18/21  2:55 PM   Specimen: BLOOD  Result Value Ref Range Status   Specimen Description BLOOD BLOOD RIGHT ARM   Final   Special Requests   Final    BOTTLES DRAWN AEROBIC AND ANAEROBIC Blood Culture adequate volume   Culture   Final    NO GROWTH 2 DAYS Performed at [facility], [address], [place], [place] [address]    Report Status PENDING  Incomplete      Studies: No results found.  Scheduled Meds:  amLODipine  5 mg Oral Daily   apixaban  10 mg Oral BID   Followed by   Melene Muller ON 04/26/2021] apixaban  5 mg Oral BID   atorvastatin  10 mg Oral QHS   carvedilol  25 mg Oral BID   gabapentin  300 mg Oral TID   potassium chloride  40 mEq Oral TID   QUEtiapine  300 mg Oral QHS    Continuous Infusions:  lactated ringers 75 mL/hr at 04/20/21 0601     LOS: 0 days     Myrtie Neither, MD [facility]  To reach me or the doctor on call, go to: [email or web address] Password [email or web address]  04/20/2021, 8:45 AM

## 2021-04-20 NOTE — Progress Notes (Signed)
Patient refuses to have the bed alarm on.  I explained the reason he needed the alarm including that he came in due to a fall.  Patient was adamant about not setting the alarm.

## 2021-04-20 NOTE — Evaluation (Addendum)
Physical Therapy Evaluation Patient Details Name: Michael Alvarado MRN: [id number] DOB: 1956/04/12 Today's Date: 04/20/2021  History of Present Illness  Pt is a 65 y/o M admitted on 04/18/21 with c/c of fall. He refused CT scan of head and neck. Pt also had incidental findings of acute PE & RLE DVT, likely his cause of syncope leading to fall. PMH: dementia, HTN, HLD, DM, GERD, depression, chronic back pain  Clinical Impression  MD cleared pt for participation in PT today. Pt received in bed leaning sideways attempting to eat lunch. Pt with impaired cognition as pt was grateful PT offered to assist pt to recliner for improved positioning to consume lunch, then states he never asked to get to the chair. Pt is able to complete bed mobility with use of hospital bed features & holds to PTs hand to scoot to EOB. Pt requires min assist for stand pivot to recliner without AD. Pt left set up with meal tray.  After pt consumed lunch PT returned & pt agreeable to ambulation. Pt unable to take steps without UE support, ultimately requiring use of RW. Pt is able to ambulate in room & hallway with RW & CGA<>supervision but with very poor safety awareness & impaired gait pattern as noted below, but pt not very open to education/instruction. Pt c/o back pain during session but doesn't recall declining CT scan of back after arriving to hospital. Will continue to follow pt acutely to progress gait with LRAD, balance, and for education re: safety with AD & back precautions.   SpO2 99-100% on room air, pt declined c/o SOB during session.     Recommendations for follow up therapy are one component of a multi-disciplinary discharge planning process, led by the attending physician.  Recommendations may be updated based on patient status, additional functional criteria and insurance authorization.  Follow Up Recommendations Home health PT    Assistance Recommended at Discharge Intermittent Supervision/Assistance   Functional Status Assessment Patient has had a recent decline in their functional status and demonstrates the ability to make significant improvements in function in a reasonable and predictable amount of time.  Equipment Recommendations  Rolling walker (2 wheels);BSC/3in1    Recommendations for Other Services       Precautions / Restrictions Precautions Precautions: Fall Precaution Comments: back precautions for comfort Restrictions Weight Bearing Restrictions: No      Mobility  Bed Mobility Overal bed mobility: Needs Assistance Bed Mobility: Supine to Sit     Supine to sit: HOB elevated;Min assist     General bed mobility comments: Pt holds to PT's hand to pull himself towards EOB.    Transfers Overall transfer level: Needs assistance Equipment used: None Transfers: Sit to/from Stand;Bed to chair/wheelchair/BSC Sit to Stand: Min guard Stand pivot transfers: Min assist (no AD)              Ambulation/Gait Ambulation/Gait assistance: Min guard;Supervision Gait Distance (Feet): 150 Feet Assistive device: Rolling walker (2 wheels) Gait Pattern/deviations: Decreased step length - left;Decreased step length - right;Decreased dorsiflexion - right;Decreased dorsiflexion - left;Trunk flexed Gait velocity: decreased     General Gait Details: Extreme forward trunk lean on RW despite PT educating him on need for upright posture & to ambulate within base of AD (pt even one time states "that feels better"). Pt ambulates with BLE outside of BOS. Decreased step length RLE & decreased weight shift to RLE compared to LLE.  Stairs            Psychologist, prison and probation services  Modified Rankin (Stroke Patients Only)       Balance Overall balance assessment: Needs assistance Sitting-balance support: Feet supported;Bilateral upper extremity supported Sitting balance-Leahy Scale: Good     Standing balance support: No upper extremity supported Standing balance-Leahy Scale:  Fair Standing balance comment: Pt able to maintain static standing without BUE support & close supervision<>CGA but requires UE support on RW to take steps.                             Pertinent Vitals/Pain Pain Assessment: Faces Faces Pain Scale: Hurts even more Pain Location: back with mobility Pain Descriptors / Indicators: Discomfort;Throbbing;Grimacing Pain Intervention(s): Limited activity within patient's tolerance;Monitored during session    Home Living Family/patient expects to be discharged to:: Unsure                   Additional Comments: Pt was homeless prior to admission. During eval he states he hopes to go to one of those homes "for people like me".    Prior Function               Mobility Comments: Pt reports being independent without AD.       Hand Dominance        Extremity/Trunk Assessment   Upper Extremity Assessment Upper Extremity Assessment: Overall WFL for tasks assessed    Lower Extremity Assessment Lower Extremity Assessment: Generalized weakness    Cervical / Trunk Assessment Cervical / Trunk Assessment: Kyphotic  Communication      Cognition Arousal/Alertness: Awake/alert Behavior During Therapy: Flat affect Overall Cognitive Status: No family/caregiver present to determine baseline cognitive functioning                                 General Comments: Pt received in bed sideways attempting to eat lunch & PT offered to assist him to recliner for better positioning with pt eager but prior to exiting bed pt states he had no intention of getting to the recliner but was agreeable after PT educated him on prior conversation. Pt with decreased safety awareness, decreased awareness of use of AD, decreased receptiveness to education/instruction from PT.        General Comments General comments (skin integrity, edema, etc.): Pt with continent void standing in bathroom with close supervision<>CGA.     Exercises     Assessment/Plan    PT Assessment Patient needs continued PT services  PT Problem List Decreased strength;Decreased mobility;Decreased safety awareness;Decreased activity tolerance;Decreased balance;Decreased knowledge of use of DME;Decreased knowledge of precautions;Cardiopulmonary status limiting activity;Decreased cognition;Pain       PT Treatment Interventions DME instruction;Therapeutic activities;Gait training;Therapeutic exercise;Patient/family education;Modalities;Stair training;Balance training;Functional mobility training;Manual techniques;Neuromuscular re-education    PT Goals (Current goals can be found in the Care Plan section)  Acute Rehab PT Goals Patient Stated Goal: decreased pain PT Goal Formulation: With patient Time For Goal Achievement: 05/04/21 Potential to Achieve Goals: Good    Frequency Min 2X/week   Barriers to discharge Decreased caregiver support;Inaccessible home environment      Co-evaluation               AM-PAC PT "6 Clicks" Mobility  Outcome Measure Help needed turning from your back to your side while in a flat bed without using bedrails?: A Little Help needed moving from lying on your back to sitting on the side of a flat bed without using bedrails?:  A Little Help needed moving to and from a bed to a chair (including a wheelchair)?: A Little Help needed standing up from a chair using your arms (e.g., wheelchair or bedside chair)?: A Little Help needed to walk in hospital room?: A Little Help needed climbing 3-5 steps with a railing? : A Lot 6 Click Score: 17    End of Session Equipment Utilized During Treatment: Gait belt Activity Tolerance: Patient tolerated treatment well Patient left: in chair;with chair alarm set;with call bell/phone within reach Nurse Communication: Mobility status PT Visit Diagnosis: Unsteadiness on feet (R26.81);Muscle weakness (generalized) (M62.81);Difficulty in walking, not elsewhere classified  (R26.2);Pain Pain - part of body:  (back)    Time: 0349-1791 and 1320-1336 PT Time Calculation (min) (ACUTE ONLY): 28 min   Charges:   PT Evaluation $PT Eval Moderate Complexity: 1 Mod PT Treatments $Therapeutic Activity: 8-22 mins        Aleda Grana, PT, DPT 04/20/21, 2:03 PM   Sandi Mariscal 04/20/2021, 1:58 PM

## 2021-04-21 DIAGNOSIS — N179 Acute kidney failure, unspecified: Secondary | ICD-10-CM | POA: Diagnosis not present

## 2021-04-21 LAB — GLUCOSE, CAPILLARY: Glucose-Capillary: 111 mg/dL — ABNORMAL HIGH (ref 70–99)

## 2021-04-21 NOTE — Plan of Care (Signed)
  Problem: Health Behavior/Discharge Planning: Goal: Ability to manage health-related needs will improve 04/21/2021 0357 by Sheria Lang, RN Outcome: Progressing 04/21/2021 0356 by Sheria Lang, RN Outcome: Progressing   Problem: Clinical Measurements: Goal: Ability to maintain clinical measurements within normal limits will improve 04/21/2021 0357 by Sheria Lang, RN Outcome: Progressing 04/21/2021 0356 by Sheria Lang, RN Outcome: Progressing Goal: Will remain free from infection 04/21/2021 0357 by Sheria Lang, RN Outcome: Progressing 04/21/2021 0356 by Sheria Lang, RN Outcome: Progressing Goal: Diagnostic test results will improve 04/21/2021 0357 by Sheria Lang, RN Outcome: Progressing 04/21/2021 0356 by Sheria Lang, RN Outcome: Progressing Goal: Respiratory complications will improve 04/21/2021 0357 by Sheria Lang, RN Outcome: Progressing 04/21/2021 0356 by Sheria Lang, RN Outcome: Progressing Goal: Cardiovascular complication will be avoided 04/21/2021 0357 by Sheria Lang, RN Outcome: Progressing 04/21/2021 0356 by Sheria Lang, RN Outcome: Progressing   Problem: Activity: Goal: Risk for activity intolerance will decrease 04/21/2021 0357 by Sheria Lang, RN Outcome: Progressing 04/21/2021 0356 by Sheria Lang, RN Outcome: Progressing   Problem: Nutrition: Goal: Adequate nutrition will be maintained 04/21/2021 0357 by Sheria Lang, RN Outcome: Progressing 04/21/2021 0356 by Sheria Lang, RN Outcome: Progressing   Problem: Coping: Goal: Level of anxiety will decrease 04/21/2021 0357 by Sheria Lang, RN Outcome: Progressing 04/21/2021 0356 by Sheria Lang, RN Outcome: Progressing   Problem: Elimination: Goal: Will not experience complications related to bowel motility 04/21/2021 0357 by Sheria Lang, RN Outcome: Progressing 04/21/2021 0356 by Sheria Lang, RN Outcome: Progressing Goal: Will not experience complications related to urinary retention 04/21/2021 0357 by  Sheria Lang, RN Outcome: Progressing 04/21/2021 0356 by Sheria Lang, RN Outcome: Progressing   Problem: Pain Managment: Goal: General experience of comfort will improve 04/21/2021 0357 by Sheria Lang, RN Outcome: Progressing 04/21/2021 0356 by Sheria Lang, RN Outcome: Progressing   Problem: Safety: Goal: Ability to remain free from injury will improve 04/21/2021 0357 by Sheria Lang, RN Outcome: Progressing 04/21/2021 0356 by Sheria Lang, RN Outcome: Progressing   Problem: Skin Integrity: Goal: Risk for impaired skin integrity will decrease 04/21/2021 0357 by Sheria Lang, RN Outcome: Progressing 04/21/2021 0356 by Sheria Lang, RN Outcome: Progressing

## 2021-04-21 NOTE — Plan of Care (Signed)

## 2021-04-21 NOTE — Progress Notes (Signed)
PROGRESS NOTE    [name]   NID:[id number]  DOB: 03-14-1956  PCP: Danella Penton, MD    DOA: 04/18/2021 LOS: 0    Brief Narrative / Hospital Course to Date:   HPI: Michael Alvarado is a 65 y.o. male with medical history significant of homeless, dementia, hypertension, hyperlipidemia, diabetes mellitus, GERD, depression, chronic back pain, who presented to the ED after having a fall while walking for a long time, got tired and dizzy prior to the fall.  He denied LOC or significant injury, and declined CT scans of head and neck in the ED.  Further evaluation found patient to have an acute PE and DVT, AKI, leukoctyosis, mild CK elevation.  Initially treated with heparin, and was transitioned to Eliquis once monitored and remained stable.   CXR: Right apical density is now noted. CT scan of the chest with intravenous contrast is recommended to rule out nodule or mass.  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 Right apical abnormality on prior chest x-ray is related to tortuous vascularity and mild mediastinal lipomatosis as well as patient rotation on the prior exam.  Assessment & Plan   Principal Problem:   AKI (acute kidney injury) (HCC) Active Problems:   Dementia (HCC)   Diabetes mellitus without complication (HCC)   HTN (hypertension)   Homeless   Fall at home, initial encounter   Elevated troponin   Hypercalcemia   Chronic back pain   SIRS (systemic inflammatory response syndrome) (HCC)   Depression   Acute pulmonary embolism (HCC)   Syncope/fall: likely due to pulmonary embolism pt reported after admission that he felt dizzy prior to falling, that it was not a mechanical fall, that he actually passed out.   --PT OT evaluations   Acute PE/Right lower extremity DVT: Incidental finding.   Started on heparin drip.  No heart strain evident on CTA chest.  Has been hemodynamically stable.   --Continue Eliquis    AKI: resolved with IV fluids.   Likely due to dehydration with use of diuretics and Cozaar.   --avoid nephrotoxic agents  --holding Bumex, HCTZ and Cozaar --resume when indicated and as BP tolerates   Dementia: without behavior disturbance.   Baseline cognition unclear, but on my encounter, appears to have fairly good memory and cognitive processing.   Hypokalemia: Replaced for K 2.8.  Resolved. --K is low-normal, further replacement today --BMP in AM   Prediabetes: Recent hemoglobin A1c only 5.8%. Monitor fasting glucose.   Essential hypertension: Presented with very high blood pressure but did have very low blood pressure earlier this morning.  Currently within normal range.  Continue amlodipine and Coreg but continue to hold Bumex, HCTZ and Cozaar.   Homelessness: TOC consulted. Per OC, followed by APS for SNF placement. "Any and everything concerning this pt's goals of care is dictated by APS. Their social worker w/ Adult Management consultant is working with finding placement and submitting the FL2. For additional information, contact social worker w/ DSS Nancy Fetter [phone]"  --Patient will need his usual medication refilled as he lost his shoe box containing all of his pill bottles.   Elevated troponin: Very mild elevation and flat.  No chest pain.  Likely secondary to PE and demand ischemia.   SIRS (systemic inflammatory response syndrome) [facility]): Patient meets criteria for SIRS with WBC 14.0, heart rate 95, RR 28, no source of infection identified.  Likely secondary to combination of  dehydration and PE. Procalcitonin 0.13.  No antibiotics indicated. No s/sx's of infection.  Monitor   Hypercalcemia: Resolved. Likely from dehydration   Chronic back pain -prn Percocet, Tylenol -Continue home Neurontin   Depression -Seroquel   Multiple abrasions/scratch to bilateral knee secondary to fall: Noted.  No signs of infection.  Ascending thoracic aortic aneurysm - seen on CT  chest w contrast.  Recommend semi-annual imaging followup by CTA or MRA and referral to cardiothoracic surgery if not already obtained.  Obesity: Body mass index is 37.36 kg/m.  with > 50% spent at bedside and in coordination of care   DVT prophylaxis:  apixaban (ELIQUIS) tablet 10 mg  apixaban (ELIQUIS) tablet 5 mg   Diet:  Diet Orders (From admission, onward)     Start     Ordered   04/18/21 1318  Diet Heart Room service appropriate? Yes; Fluid consistency: Thin  Diet effective now       Question Answer Comment  Room service appropriate? Yes   Fluid consistency: Thin   Na restriction, if any: 2 gm Na      04/18/21 1317              Code Status: Full Code   Subjective 04/21/21    Pt awake resting in bed.  Reports back pain bothering him.  No chest pain, SOB, dizzy or lightheadedness, N/V or other complaints.   States that he misplaced the box he carried all of his medications in, will need refills when he leaves the hospital.   Disposition Plan & Communication   Status is: Observation  The patient remains OBS appropriate and will d/c before 2 midnights.  Unsafe discharge, homeless with dementia.  Dispo: APS following and pursuing SNF placement.   Consults, Procedures, Significant Events   Consultants:  TOC  Procedures:    Antimicrobials:  Anti-infectives (From admission, onward)    None         Micro    Objective   Vitals:   04/21/21 0500 04/21/21 0511 04/21/21 0800 04/21/21 1612  BP: 122/70 139/81 138/80 (!) 151/97  Pulse: 75 65 71 69  Resp: 18 18 18 18   Temp: 98.1 F (36.7 C)  (!) 97.3 F (36.3 C) 98.7 F (37.1 C)  TempSrc: Oral  Oral Oral  SpO2: 99% 99% 99% 92%  Weight:      Height:        Intake/Output Summary (Last 24 hours) at 04/21/2021 1726 Last data filed at 04/21/2021 1717 Gross per 24 hour  Intake 1170 ml  Output 450 ml  Net 720 ml   Filed Weights   04/18/21 1216  Weight: 114.8 kg    Physical Exam:  General  exam: awake, alert, no acute distress, obese Respiratory system: on room air, normal respiratory effort. Cardiovascular system: RRR, no pedal edema.   Central nervous system: A&O x3. no gross focal neurologic deficits, normal speech Extremities: scattered abrasions on knees and lower right leg, no edema, normal tone Psychiatry: normal mood, congruent affect  Labs   Data Reviewed: I have personally reviewed following labs and imaging studies  CBC: Recent Labs  Lab 04/18/21 0130 04/19/21 0734  WBC 14.0* 8.9  NEUTROABS 10.9*  --   HGB 17.5* 14.3  HCT 48.0 39.4  MCV 90.6 90.2  PLT 257 179   Basic Metabolic Panel: Recent Labs  Lab 04/18/21 0130 04/18/21 0450 04/18/21 1159 04/19/21 0734 04/20/21 0535  NA 137 136 135 140 141  K 2.9* 3.0* 3.5 2.8* 3.5  CL 102 101 98 104 107  CO2 20* 22 23 29 28   GLUCOSE 166* 152* 132* 136* 105*  BUN 32* 34* 33* 25* 17  CREATININE 1.95* 1.59* 1.56* 1.05 0.76  CALCIUM 10.2 10.5* 10.4* 9.9 10.0   GFR: Estimated Creatinine Clearance: 111.9 mL/min (by C-G formula based on SCr of 0.76 mg/dL). Liver Function Tests: Recent Labs  Lab 04/18/21 0450  AST 24  ALT 29  ALKPHOS 87  BILITOT 1.3*  PROT 7.5  ALBUMIN 4.1   No results for input(s): LIPASE, AMYLASE in the last 168 hours. No results for input(s): AMMONIA in the last 168 hours. Coagulation Profile: Recent Labs  Lab 04/18/21 1753  INR 1.2   Cardiac Enzymes: Recent Labs  Lab 04/18/21 0130  CKTOTAL 393   BNP (last 3 results) No results for input(s): PROBNP in the last 8760 hours. HbA1C: No results for input(s): HGBA1C in the last 72 hours. CBG: Recent Labs  Lab 04/19/21 0900 04/20/21 0833 04/21/21 0758  GLUCAP 130* 96 111*   Lipid Profile: Recent Labs    04/19/21 0734  CHOL 124  HDL 40*  LDLCALC 53  TRIG 04/21/21*  CHOLHDL 3.1   Thyroid Function Tests: No results for input(s): TSH, T4TOTAL, FREET4, T3FREE, THYROIDAB in the last 72 hours. Anemia Panel: No results  for input(s): VITAMINB12, FOLATE, FERRITIN, TIBC, IRON, RETICCTPCT in the last 72 hours. Sepsis Labs: Recent Labs  Lab 04/18/21 1114 04/18/21 1413  PROCALCITON 0.13  --   LATICACIDVEN  --  1.8    Recent Results (from the past 240 hour(s))  Resp Panel by RT-PCR (Flu A&B, Covid) Nasopharyngeal Swab     Status: None   Collection Time: 04/18/21 11:14 AM   Specimen: Nasopharyngeal Swab; Nasopharyngeal(NP) swabs in vial transport medium  Result Value Ref Range Status   SARS Coronavirus 2 by RT PCR NEGATIVE NEGATIVE Final    Comment: (NOTE) SARS-CoV-2 target nucleic acids are NOT DETECTED.  The SARS-CoV-2 RNA is generally detectable in upper respiratory specimens during the acute phase of infection. The lowest concentration of SARS-CoV-2 viral copies this assay can detect is 138 copies/mL. A negative result does not preclude SARS-Cov-2 infection and should not be used as the sole basis for treatment or other patient management decisions. A negative result may occur with  improper specimen collection/handling, submission of specimen other than nasopharyngeal swab, presence of viral mutation(s) within the areas targeted by this assay, and inadequate number of viral copies(<138 copies/mL). A negative result must be combined with clinical observations, patient history, and epidemiological information. The expected result is Negative.  Fact Sheet for Patients:  04/20/21  Fact Sheet for Healthcare Providers:  BloggerCourse.com  This test is no t yet approved or cleared by the [place] FDA and  has been authorized for detection and/or diagnosis of SARS-CoV-2 by FDA under an Emergency Use Authorization (EUA). This EUA will remain  in effect (meaning this test can be used) for the duration of the COVID-19 declaration under Section 564(b)(1) of the Act, 21 U.S.C.section 360bbb-3(b)(1), unless the authorization is terminated  or  revoked sooner.       Influenza A by PCR NEGATIVE NEGATIVE Final   Influenza B by PCR NEGATIVE NEGATIVE Final    Comment: (NOTE) The Xpert Xpress SARS-CoV-2/FLU/RSV plus assay is intended as an aid in the diagnosis of influenza from Nasopharyngeal swab specimens and should not be used as a sole basis for treatment. Nasal washings and aspirates are unacceptable for Xpert Xpress SARS-CoV-2/FLU/RSV testing.  Fact Sheet for Patients: BloggerCourse.com  Fact Sheet for Healthcare Providers: SeriousBroker.it  This test is not yet approved or cleared by the [place] FDA and has been authorized for detection and/or diagnosis of SARS-CoV-2 by FDA under an Emergency Use Authorization (EUA). This EUA will remain in effect (meaning this test can be used) for the duration of the COVID-19 declaration under Section 564(b)(1) of the Act, 21 U.S.C. section 360bbb-3(b)(1), unless the authorization is terminated or revoked.  Performed at [facility], [address] [place], [place] [address]   Culture, blood (x 2)     Status: None (Preliminary result)   Collection Time: 04/18/21  2:13 PM   Specimen: BLOOD  Result Value Ref Range Status   Specimen Description BLOOD RIGHT ANTECUBITAL  Final   Special Requests   Final    BOTTLES DRAWN AEROBIC AND ANAEROBIC Blood Culture adequate volume   Culture   Final    NO GROWTH 3 DAYS Performed at [facility], [address] [place], [place] [address]    Report Status PENDING  Incomplete  Culture, blood (x 2)     Status: None (Preliminary result)   Collection Time: 04/18/21  2:55 PM   Specimen: BLOOD  Result Value Ref Range Status   Specimen Description BLOOD BLOOD RIGHT ARM  Final   Special Requests   Final    BOTTLES DRAWN AEROBIC AND ANAEROBIC Blood Culture adequate volume   Culture   Final    NO GROWTH 3 DAYS Performed at [facility], [address],  [place], [place] [address]    Report Status PENDING  Incomplete      Imaging Studies   No results found.   Medications   Scheduled Meds:  amLODipine  5 mg Oral Daily   apixaban  10 mg Oral BID   Followed by   Melene Muller ON 04/26/2021] apixaban  5 mg Oral BID   atorvastatin  10 mg Oral QHS   carvedilol  25 mg Oral BID   gabapentin  300 mg Oral TID   potassium chloride  40 mEq Oral TID   QUEtiapine  300 mg Oral QHS   Continuous Infusions:     LOS: 0 days    Time spent: 30 minutes with > 50% spent at bedside and in coordination of care     Pennie Banter, DO [facility]  04/21/2021, 5:26 PM      If 7PM-7AM, please contact night-coverage. How to contact the [facility] Attending or Consulting provider 7A - 7P or covering provider during after hours 7P -7A, for this patient?    Check the care team in [facility] and look for a) attending/consulting [facility] provider listed and b) the [facility] team listed Log into [email or web address] and use [place]'s universal password to access. If you do not have the password, please contact the hospital operator. Locate the [facility] provider you are looking for under [facility] and page to a number that you can be directly reached. If you still have difficulty reaching the provider, please page the [facility] (Director on Call) for the Hospitalists listed on amion for assistance.

## 2021-04-22 DIAGNOSIS — N179 Acute kidney failure, unspecified: Secondary | ICD-10-CM | POA: Diagnosis not present

## 2021-04-22 LAB — BASIC METABOLIC PANEL
Anion gap: 6 (ref 5–15)
BUN: 14 mg/dL (ref 8–23)
CO2: 23 mmol/L (ref 22–32)
Calcium: 10.3 mg/dL (ref 8.9–10.3)
Chloride: 109 mmol/L (ref 98–111)
Creatinine, Ser: 0.8 mg/dL (ref 0.61–1.24)
GFR, Estimated: >60 mL/min (ref >60)
Glucose, Bld: 108 mg/dL — ABNORMAL HIGH (ref 70–99)
Potassium: 4.8 mmol/L (ref 3.5–5.1)
Sodium: 138 mmol/L (ref 135–145)

## 2021-04-22 LAB — GLUCOSE, CAPILLARY
Glucose-Capillary: 133 mg/dL — ABNORMAL HIGH (ref 70–99)
Glucose-Capillary: 117 mg/dL — ABNORMAL HIGH (ref 70–99)

## 2021-04-22 NOTE — TOC Progression Note (Signed)
Transition of Care [facility]) - Progression Note    Patient Details  Name: Michael Alvarado MRN: [id number] Date of Birth: 30-Mar-1956  Transition of Care [facility]) CM/SW Contact  Chapman Fitch, RN Phone Number: 04/22/2021, 1:58 PM  Clinical Narrative:     Spoke with Joy from APS.  She states that she has secured patient placement at [facility] home.  She states that she will be going to locations in [place] and [place] tomorrow to determine which location would be the best fit for the patient.  She hopes to have him placed before Thursday as the APS office is closed on Thursday and Friday  Sister and [facility] supervisor updated   Expected Discharge Plan: Skilled Nursing Facility Barriers to Discharge: Continued Medical Work up  Expected Discharge Plan and Services Expected Discharge Plan: Skilled Nursing Facility In-house Referral: Clinical Social Work   Post Acute Care Choice: Skilled Nursing Facility Living arrangements for the past 2 months: Single Family Home                                       Social Determinants of Health (SDOH) Interventions    Readmission Risk Interventions No flowsheet data found.

## 2021-04-22 NOTE — Progress Notes (Signed)
Physical Therapy Treatment Patient Details Name: Michael Alvarado MRN: [id number] DOB: 12-Feb-1956 Today's Date: 04/22/2021   History of Present Illness Pt is a 65 y/o M admitted on 04/18/21 with c/c of fall. He refused CT scan of head and neck. Pt also had incidental findings of acute PE & RLE DVT, likely his cause of syncope leading to fall. PMH: dementia, HTN, HLD, DM, GERD, depression, chronic back pain    PT Comments    Pt seen for PT tx with pt demonstrating impaired cognition, not recalling working with this PT two days ago. Pt requires extra time to initiate bed mobility but is able to complete without physical assistance.  Pt completes transfers with CGA & gait with min assist but declines using AD despite PT educating pt multiple times on benefits & need for RW, instead pt reaches for furniture (window sill, rail in hallway, counter top) throughout entire session. Pt continues to be resistant to education. Will continue to follow pt acutely to address gait with LRAD, balance, & safety awareness.    Recommendations for follow up therapy are one component of a multi-disciplinary discharge planning process, led by the attending physician.  Recommendations may be updated based on patient status, additional functional criteria and insurance authorization.  Follow Up Recommendations  Home health PT     Assistance Recommended at Discharge Intermittent Supervision/Assistance  Equipment Recommendations  Rolling walker (2 wheels);BSC/3in1    Recommendations for Other Services       Precautions / Restrictions Precautions Precautions: Fall Precaution Comments: back precautions for comfort Restrictions Weight Bearing Restrictions: No     Mobility  Bed Mobility Overal bed mobility: Needs Assistance Bed Mobility: Supine to Sit     Supine to sit: Supervision;HOB elevated     General bed mobility comments: use of bed rails, extra time & cuing to initiate movement     Transfers Overall transfer level: Needs assistance Equipment used:  (pt reaches for bed rail, window sill but declines use of RW) Transfers: Sit to/from Stand;Bed to chair/wheelchair/BSC Sit to Stand: Min guard Stand pivot transfers: Min guard         General transfer comment: use furniture in room    Ambulation/Gait Ambulation/Gait assistance: Min assist Gait Distance (Feet): 100 Feet   Gait Pattern/deviations: Decreased step length - left;Decreased dorsiflexion - right;Decreased step length - right;Decreased dorsiflexion - left;Decreased stride length;Shuffle Gait velocity: decreased     General Gait Details: decreased step length RLE   Stairs             Wheelchair Mobility    Modified Rankin (Stroke Patients Only)       Balance Overall balance assessment: Needs assistance Sitting-balance support: No upper extremity supported;Feet supported Sitting balance-Leahy Scale: Good     Standing balance support: No upper extremity supported;During functional activity Standing balance-Leahy Scale: Fair Standing balance comment: can stand without UE support with supervision but requires UE support & min assist to ambulate safely                            Cognition Arousal/Alertness: Awake/alert Behavior During Therapy: Flat affect Overall Cognitive Status: No family/caregiver present to determine baseline cognitive functioning                                 General Comments: Pt follows commands with extra time, doesn't recall working with this PT 2  days ago. Appears to have some baseline cognitive deficits. Not receptive to education.        Exercises      General Comments General comments (skin integrity, edema, etc.): Assisted pt with changing into a clean gown.      Pertinent Vitals/Pain Pain Assessment: Faces Faces Pain Scale: Hurts whole lot Pain Location: back Pain Descriptors / Indicators:  Discomfort;Throbbing;Grimacing Pain Intervention(s): Limited activity within patient's tolerance;Monitored during session    Home Living                          Prior Function            PT Goals (current goals can now be found in the care plan section) Acute Rehab PT Goals Patient Stated Goal: decreased pain PT Goal Formulation: With patient Time For Goal Achievement: 05/04/21 Potential to Achieve Goals: Good Progress towards PT goals: Progressing toward goals    Frequency    Min 2X/week      PT Plan Current plan remains appropriate    Co-evaluation              AM-PAC PT "6 Clicks" Mobility   Outcome Measure  Help needed turning from your back to your side while in a flat bed without using bedrails?: None Help needed moving from lying on your back to sitting on the side of a flat bed without using bedrails?: A Little Help needed moving to and from a bed to a chair (including a wheelchair)?: A Little Help needed standing up from a chair using your arms (e.g., wheelchair or bedside chair)?: A Little Help needed to walk in hospital room?: A Little Help needed climbing 3-5 steps with a railing? : A Lot 6 Click Score: 18    End of Session Equipment Utilized During Treatment: Gait belt Activity Tolerance: Patient limited by pain Patient left: in chair;with call bell/phone within reach;with chair alarm set   PT Visit Diagnosis: Unsteadiness on feet (R26.81);Muscle weakness (generalized) (M62.81);Difficulty in walking, not elsewhere classified (R26.2);Pain Pain - part of body:  (back)     Time: 9476-5465 PT Time Calculation (min) (ACUTE ONLY): 16 min  Charges:  $Therapeutic Activity: 8-22 mins                     Aleda Grana, PT, DPT 04/22/21, 12:12 PM    Sandi Mariscal 04/22/2021, 12:10 PM

## 2021-04-22 NOTE — Progress Notes (Signed)
PROGRESS NOTE    [name]   VEH:[id number]  DOB: June 08, 1955  PCP: Danella Penton, MD    DOA: 04/18/2021 LOS: 0    Brief Narrative / Hospital Course to Date:   HPI: Michael Alvarado is a 65 y.o. male with medical history significant of homeless, dementia, hypertension, hyperlipidemia, diabetes mellitus, GERD, depression, chronic back pain, who presented to the ED after having a fall while walking for a long time, got tired and dizzy prior to the fall.  He denied LOC or significant injury, and declined CT scans of head and neck in the ED.  Further evaluation found patient to have an acute PE and DVT, AKI, leukoctyosis, mild CK elevation.  Initially treated with heparin, and was transitioned to Eliquis once monitored and remained stable.   CXR: Right apical density is now noted. CT scan of the chest with intravenous contrast is recommended to rule out nodule or mass.  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 Right apical abnormality on prior chest x-ray is related to tortuous vascularity and mild mediastinal lipomatosis as well as patient rotation on the prior exam.  Assessment & Plan   Principal Problem:   AKI (acute kidney injury) (HCC) Active Problems:   Dementia (HCC)   Diabetes mellitus without complication (HCC)   HTN (hypertension)   Homeless   Fall at home, initial encounter   Elevated troponin   Hypercalcemia   Chronic back pain   SIRS (systemic inflammatory response syndrome) (HCC)   Depression   Acute pulmonary embolism (HCC)   Syncope/fall: likely due to pulmonary embolism pt reported after admission that he felt dizzy prior to falling, that it was not a mechanical fall, that he actually passed out.   --PT OT evaluations -Home health PT recommended   Acute PE/Right lower extremity DVT: Incidental finding.   Started on heparin drip.  No heart strain evident on CTA chest.  Has been hemodynamically  stable. --Continue Eliquis   AKI: resolved with IV fluids.   Likely due to dehydration with use of diuretics and Cozaar.   --avoid nephrotoxic agents  --holding Bumex, HCTZ and Cozaar --resume when indicated and as BP tolerates   Dementia: without behavior disturbance.   Baseline cognition unclear, but on my encounter, appears to have fairly good memory and cognitive processing.   Hypokalemia: Replaced for K 2.8.  Resolved with replacement. --Monitor and replace as needed   Prediabetes: Recent hemoglobin A1c only 5.8%. Monitor fasting glucose.   Essential hypertension: Presented with very high blood pressure but did have very low blood pressure earlier this morning.  Currently within normal range.  Continue amlodipine and Coreg but continue to hold Bumex, HCTZ and Cozaar.   Homelessness: TOC consulted. Per OC, followed by APS for SNF placement. "Any and everything concerning this pt's goals of care is dictated by APS. Their social worker w/ Adult Management consultant is working with finding placement and submitting the FL2. For additional information, contact social worker w/ DSS Nancy Fetter [phone]"  --Patient will need his usual medication refilled as he lost his shoe box containing all of his pill bottles.   Elevated troponin: Very mild elevation and flat.  No chest pain.  Likely secondary to PE and demand ischemia.   SIRS (systemic inflammatory response syndrome) [facility]): Patient meets criteria for SIRS with WBC 14.0, heart rate 95, RR 28, no source of infection identified.  Likely secondary to combination of  dehydration and PE. Procalcitonin 0.13.  No antibiotics indicated. No s/sx's of infection.  Monitor   Hypercalcemia: Resolved. Likely from dehydration   Chronic back pain -prn Percocet, Tylenol -Continue home Neurontin   Depression -Seroquel   Multiple abrasions/scratch to bilateral knee secondary to fall: Noted.  No signs of infection.  Ascending thoracic aortic  aneurysm - seen on CT chest w contrast.  Recommend semi-annual imaging followup by CTA or MRA and referral to cardiothoracic surgery if not already obtained.  Obesity: Body mass index is 37.36 kg/m.  with > 50% spent at bedside and in coordination of care   DVT prophylaxis:  apixaban (ELIQUIS) tablet 10 mg  apixaban (ELIQUIS) tablet 5 mg   Diet:  Diet Orders (From admission, onward)     Start     Ordered   04/18/21 1318  Diet Heart Room service appropriate? Yes; Fluid consistency: Thin  Diet effective now       Question Answer Comment  Room service appropriate? Yes   Fluid consistency: Thin   Na restriction, if any: 2 gm Na      04/18/21 1317              Code Status: Full Code   Subjective 04/22/21    Pt sleeping comfortably when seen today.  Woke very briefly reported being tired.  No acute complaints, no acute events reported.   Disposition Plan & Communication   Status is: Observation  The patient remains OBS appropriate and will d/c before 2 midnights.   Unsafe discharge, homeless with dementia.  Placement pending.  Dispo: APS following and pursuing facility placement.   Consults, Procedures, Significant Events   Consultants:  TOC  Procedures:    Antimicrobials:  Anti-infectives (From admission, onward)    None         Micro    Objective   Vitals:   04/21/21 1612 04/21/21 2012 04/22/21 0401 04/22/21 0834  BP: (!) 151/97 (!) 146/92 (!) 161/91 133/82  Pulse: 69 85 70 71  Resp: 18 16 16    Temp: 98.7 F (37.1 C) 98.6 F (37 C) 97.9 F (36.6 C) 97.6 F (36.4 C)  TempSrc: Oral Oral    SpO2: 92% 94% 99% 100%  Weight:      Height:        Intake/Output Summary (Last 24 hours) at 04/22/2021 1417 Last data filed at 04/22/2021 1038 Gross per 24 hour  Intake 1230 ml  Output 300 ml  Net 930 ml   Filed Weights   04/18/21 1216  Weight: 114.8 kg    Physical Exam:  General exam: Sleeping comfortably, no acute distress,  obese Respiratory system: Lungs clear, normal respiratory effort on room air. Cardiovascular system: RRR, no pedal edema.   Central nervous system: A&O x3. no gross focal neurologic deficits Extremities: scattered abrasions on knees and lower right leg, no edema, normal tone   Labs   Data Reviewed: I have personally reviewed following labs and imaging studies  CBC: Recent Labs  Lab 04/18/21 0130 04/19/21 0734  WBC 14.0* 8.9  NEUTROABS 10.9*  --   HGB 17.5* 14.3  HCT 48.0 39.4  MCV 90.6 90.2  PLT 257 179   Basic Metabolic Panel: Recent Labs  Lab 04/18/21 0450 04/18/21 1159 04/19/21 0734 04/20/21 0535 04/22/21 0706  NA 136 135 140 141 138  K 3.0* 3.5 2.8* 3.5 4.8  CL 101 98 104 107 109  CO2 22 23 29 28 23   GLUCOSE 152* 132* 136* 105* 108*  BUN 34* 33* 25* 17 14  CREATININE 1.59* 1.56* 1.05 0.76 0.80  CALCIUM 10.5* 10.4* 9.9 10.0 10.3   GFR: Estimated Creatinine Clearance: 111.9 mL/min (by C-G formula based on SCr of 0.8 mg/dL). Liver Function Tests: Recent Labs  Lab 04/18/21 0450  AST 24  ALT 29  ALKPHOS 87  BILITOT 1.3*  PROT 7.5  ALBUMIN 4.1   No results for input(s): LIPASE, AMYLASE in the last 168 hours. No results for input(s): AMMONIA in the last 168 hours. Coagulation Profile: Recent Labs  Lab 04/18/21 1753  INR 1.2   Cardiac Enzymes: Recent Labs  Lab 04/18/21 0130  CKTOTAL 393   BNP (last 3 results) No results for input(s): PROBNP in the last 8760 hours. HbA1C: No results for input(s): HGBA1C in the last 72 hours. CBG: Recent Labs  Lab 04/19/21 0900 04/20/21 0833 04/21/21 0758 04/22/21 1122  GLUCAP 130* 96 111* 133*   Lipid Profile: No results for input(s): CHOL, HDL, LDLCALC, TRIG, CHOLHDL, LDLDIRECT in the last 72 hours.  Thyroid Function Tests: No results for input(s): TSH, T4TOTAL, FREET4, T3FREE, THYROIDAB in the last 72 hours. Anemia Panel: No results for input(s): VITAMINB12, FOLATE, FERRITIN, TIBC, IRON, RETICCTPCT in  the last 72 hours. Sepsis Labs: Recent Labs  Lab 04/18/21 1114 04/18/21 1413  PROCALCITON 0.13  --   LATICACIDVEN  --  1.8    Recent Results (from the past 240 hour(s))  Resp Panel by RT-PCR (Flu A&B, Covid) Nasopharyngeal Swab     Status: None   Collection Time: 04/18/21 11:14 AM   Specimen: Nasopharyngeal Swab; Nasopharyngeal(NP) swabs in vial transport medium  Result Value Ref Range Status   SARS Coronavirus 2 by RT PCR NEGATIVE NEGATIVE Final    Comment: (NOTE) SARS-CoV-2 target nucleic acids are NOT DETECTED.  The SARS-CoV-2 RNA is generally detectable in upper respiratory specimens during the acute phase of infection. The lowest concentration of SARS-CoV-2 viral copies this assay can detect is 138 copies/mL. A negative result does not preclude SARS-Cov-2 infection and should not be used as the sole basis for treatment or other patient management decisions. A negative result may occur with  improper specimen collection/handling, submission of specimen other than nasopharyngeal swab, presence of viral mutation(s) within the areas targeted by this assay, and inadequate number of viral copies(<138 copies/mL). A negative result must be combined with clinical observations, patient history, and epidemiological information. The expected result is Negative.  Fact Sheet for Patients:  BloggerCourse.com  Fact Sheet for Healthcare Providers:  SeriousBroker.it  This test is no t yet approved or cleared by the [place] FDA and  has been authorized for detection and/or diagnosis of SARS-CoV-2 by FDA under an Emergency Use Authorization (EUA). This EUA will remain  in effect (meaning this test can be used) for the duration of the COVID-19 declaration under Section 564(b)(1) of the Act, 21 U.S.C.section 360bbb-3(b)(1), unless the authorization is terminated  or revoked sooner.       Influenza A by PCR NEGATIVE NEGATIVE Final    Influenza B by PCR NEGATIVE NEGATIVE Final    Comment: (NOTE) The Xpert Xpress SARS-CoV-2/FLU/RSV plus assay is intended as an aid in the diagnosis of influenza from Nasopharyngeal swab specimens and should not be used as a sole basis for treatment. Nasal washings and aspirates are unacceptable for Xpert Xpress SARS-CoV-2/FLU/RSV testing.  Fact Sheet for Patients: BloggerCourse.com  Fact Sheet for Healthcare Providers: SeriousBroker.it  This test is not yet approved or cleared by the [place] FDA  and has been authorized for detection and/or diagnosis of SARS-CoV-2 by FDA under an Emergency Use Authorization (EUA). This EUA will remain in effect (meaning this test can be used) for the duration of the COVID-19 declaration under Section 564(b)(1) of the Act, 21 U.S.C. section 360bbb-3(b)(1), unless the authorization is terminated or revoked.  Performed at [facility], [address] [place], [place] [address]   Culture, blood (x 2)     Status: None (Preliminary result)   Collection Time: 04/18/21  2:13 PM   Specimen: BLOOD  Result Value Ref Range Status   Specimen Description BLOOD RIGHT ANTECUBITAL  Final   Special Requests   Final    BOTTLES DRAWN AEROBIC AND ANAEROBIC Blood Culture adequate volume   Culture   Final    NO GROWTH 4 DAYS Performed at [facility], [address], [place], [place] [address]    Report Status PENDING  Incomplete  Culture, blood (x 2)     Status: None (Preliminary result)   Collection Time: 04/18/21  2:55 PM   Specimen: BLOOD  Result Value Ref Range Status   Specimen Description BLOOD BLOOD RIGHT ARM  Final   Special Requests   Final    BOTTLES DRAWN AEROBIC AND ANAEROBIC Blood Culture adequate volume   Culture   Final    NO GROWTH 4 DAYS Performed at [facility], [address], [place], [place] [address]    Report Status PENDING  Incomplete       Imaging Studies   No results found.   Medications   Scheduled Meds:  amLODipine  5 mg Oral Daily   apixaban  10 mg Oral BID   Followed by   Melene Muller ON 04/26/2021] apixaban  5 mg Oral BID   atorvastatin  10 mg Oral QHS   carvedilol  25 mg Oral BID   gabapentin  300 mg Oral TID   QUEtiapine  300 mg Oral QHS   Continuous Infusions:     LOS: 0 days    Time spent: 20 minutes      Pennie Banter, DO [facility]  04/22/2021, 2:17 PM      If 7PM-7AM, please contact night-coverage. How to contact the [facility] Attending or Consulting provider 7A - 7P or covering provider during after hours 7P -7A, for this patient?    Check the care team in [facility] and look for a) attending/consulting TRH provider listed and b) the [facility] team listed Log into [email or web address] and use [place]'s universal password to access. If you do not have the password, please contact the hospital operator. Locate the [facility] provider you are looking for under [facility] and page to a number that you can be directly reached. If you still have difficulty reaching the provider, please page the [facility] (Director on Call) for the Hospitalists listed on amion for assistance.

## 2021-04-23 DIAGNOSIS — N179 Acute kidney failure, unspecified: Secondary | ICD-10-CM | POA: Diagnosis not present

## 2021-04-23 LAB — CULTURE, BLOOD (ROUTINE X 2)
Culture: NO GROWTH
Culture: NO GROWTH
Special Requests: ADEQUATE
Special Requests: ADEQUATE

## 2021-04-23 LAB — BASIC METABOLIC PANEL
Anion gap: 6 (ref 5–15)
BUN: 15 mg/dL (ref 8–23)
CO2: 26 mmol/L (ref 22–32)
Calcium: 10.6 mg/dL — ABNORMAL HIGH (ref 8.9–10.3)
Chloride: 106 mmol/L (ref 98–111)
Creatinine, Ser: 0.93 mg/dL (ref 0.61–1.24)
GFR, Estimated: >60 mL/min (ref >60)
Glucose, Bld: 117 mg/dL — ABNORMAL HIGH (ref 70–99)
Potassium: 4.3 mmol/L (ref 3.5–5.1)
Sodium: 138 mmol/L (ref 135–145)

## 2021-04-23 LAB — GLUCOSE, CAPILLARY: Glucose-Capillary: 129 mg/dL — ABNORMAL HIGH (ref 70–99)

## 2021-04-23 LAB — TROPONIN I (HIGH SENSITIVITY): Troponin I (High Sensitivity): 6 ng/L (ref <18)

## 2021-04-23 NOTE — Progress Notes (Addendum)
PROGRESS NOTE    Michael Alvarado   KVQ:[id number]  DOB: [birth year]  PCP: Danella Penton, MD    DOA: 04/18/2021 LOS: 0    Brief Narrative / Hospital Course to Date:   HPI: Michael Alvarado is a 65 y.o. male with medical history significant of homeless, dementia, hypertension, hyperlipidemia, diabetes mellitus, GERD, depression, chronic back pain, who presented to the ED after having a fall while walking for a long time, got tired and dizzy prior to the fall.  He denied LOC or significant injury, and declined CT scans of head and neck in the ED.  Further evaluation found patient to have an acute PE and DVT, AKI, leukoctyosis, mild CK elevation.  Initially treated with heparin, and was transitioned to Eliquis once monitored and remained stable.   CXR: Right apical density is now noted. CT scan of the chest with intravenous contrast is recommended to rule out nodule or mass.  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 Right apical abnormality on prior chest x-ray is related to tortuous vascularity and mild mediastinal lipomatosis as well as patient rotation on the prior exam.  Assessment & Plan   Principal Problem:   AKI (acute kidney injury) (HCC) Active Problems:   Dementia (HCC)   Diabetes mellitus without complication (HCC)   HTN (hypertension)   Homeless   Fall at home, initial encounter   Elevated troponin   Hypercalcemia   Chronic back pain   SIRS (systemic inflammatory response syndrome) (HCC)   Depression   Acute pulmonary embolism (HCC)   Syncope/fall: likely due to pulmonary embolism pt reported after admission that he felt dizzy prior to falling, that it was not a mechanical fall, that he actually passed out.   --PT OT evaluations  --Home health PT recommended, ordered   Acute PE/Right lower extremity DVT: No heart strain evident on CTA chest.   Initially on heparin drip, transitioned to Eliquis.   Has been  hemodynamically stable. --Continue Eliquis  Left-sided chest and shoulder pain -reported morning of 11/22.  Troponin normal at 6.   No other ischemic symptoms.  Suspect musculoskeletal etiology   AKI: resolved with IV fluids.   Likely due to dehydration with use of diuretics and Cozaar.   --avoid nephrotoxic agents  --holding Bumex, HCTZ and Cozaar to avoid hypotension --resume when indicated and as BP tolerates   Dementia: without behavior disturbance.   Baseline cognition unclear, but on my encounter, appears to have fairly good memory and cognitive processing.   Hypokalemia: Replaced for K 2.8.  Resolved with replacement. --Monitor and replace as needed   Prediabetes: Recent hemoglobin A1c only 5.8%. Monitor fasting glucose.   Essential hypertension: BP now well controlled.  Presented with very high blood pressure but did have very low blood pressure earlier this morning.  Currently within normal range.   --Continue amlodipine and Coreg  --continue to hold Bumex, HCTZ and Cozaar to avoid hypotension   Homelessness: TOC consulted. Per OC, followed by APS for facility placement. "Any and everything concerning this pt's goals of care is dictated by APS. Their social worker w/ Adult Management consultant is working with finding placement and submitting the FL2. For additional information, contact Child psychotherapist w/ DSS Nancy Fetter [phone]" --11/22: Per TOC, a representative from the group home is coming to assess patient today  --Patient will need his usual medication refilled as he lost his shoe box containing all of his  pill bottles.   Elevated troponin: Very mild elevation and flat.  No chest pain.  Likely secondary to PE and demand ischemia.   SIRS (systemic inflammatory response syndrome) [facility]): Patient meets criteria for SIRS with WBC 14.0, heart rate 95, RR 28, no source of infection identified.  Likely secondary to combination of dehydration and PE. Procalcitonin 0.13.  No  antibiotics indicated. No s/sx's of infection.  Monitor   Hypercalcemia: Resolved. Likely from dehydration   Chronic back pain -prn Percocet, Tylenol -Continue home Neurontin   Depression -Seroquel   Multiple abrasions/scratch to bilateral knee secondary to fall: Noted.  No signs of infection.  Ascending thoracic aortic aneurysm - seen on CT chest w contrast.  Recommend semi-annual imaging followup by CTA or MRA and referral to cardiothoracic surgery if not already obtained.  Obesity: Body mass index is 37.36 kg/m.  with > 50% spent at bedside and in coordination of care   DVT prophylaxis:  apixaban (ELIQUIS) tablet 10 mg  apixaban (ELIQUIS) tablet 5 mg   Diet:  Diet Orders (From admission, onward)     Start     Ordered   04/18/21 1318  Diet Heart Room service appropriate? Yes; Fluid consistency: Thin  Diet effective now       Question Answer Comment  Room service appropriate? Yes   Fluid consistency: Thin   Na restriction, if any: 2 gm Na      04/18/21 1317              Code Status: Full Code   Subjective 04/23/21    Pt was awake resting in bed when seen today.  He reports ongoing back pain that "you would not believe".  Reported having some left-sided chest and anterior shoulder pain overnight and earlier this morning, currently feeling better.  No associated shortness of breath, nausea, diaphoresis or central chest pain.  No acute events reported   Disposition Plan & Communication   Status is: Observation  The patient remains OBS appropriate and will d/c before 2 midnights.   Unsafe discharge, homeless with dementia.  Placement pending.  Dispo: APS following and pursuing facility placement.  Representative from a group home is coming to assess patient today   Consults, Procedures, Significant Events   Consultants:  TOC  Procedures:    Antimicrobials:  Anti-infectives (From admission, onward)    None         Micro    Objective   Vitals:    04/22/21 1524 04/22/21 2103 04/23/21 0446 04/23/21 0814  BP: 119/76 139/78 114/64 127/82  Pulse: 66 62 60 63  Resp:  18 16 15   Temp: (!) 97.4 F (36.3 C) 98.5 F (36.9 C) 97.9 F (36.6 C) 98.1 F (36.7 C)  TempSrc:  Oral Oral Oral  SpO2: 98% 97% 99% 98%  Weight:      Height:        Intake/Output Summary (Last 24 hours) at 04/23/2021 1357 Last data filed at 04/23/2021 1033 Gross per 24 hour  Intake 480 ml  Output 650 ml  Net -170 ml   Filed Weights   04/18/21 1216  Weight: 114.8 kg    Physical Exam:  General exam: Sleeping comfortably, no acute distress, obese Respiratory system: Lungs clear bilaterally, no wheezes or rhonchi, normal respiratory effort on room air. Cardiovascular system: Normal S1-S2, regular rate and rhythm, no peripheral edema Central nervous system: Alert and oriented x3, normal speech, no gross focal deficits Gastrointestinal: Abdomen is nontender bowel sounds present.  Labs   Data Reviewed: I have personally reviewed following labs and imaging studies  CBC: Recent Labs  Lab 04/18/21 0130 04/19/21 0734  WBC 14.0* 8.9  NEUTROABS 10.9*  --   HGB 17.5* 14.3  HCT 48.0 39.4  MCV 90.6 90.2  PLT 257 179   Basic Metabolic Panel: Recent Labs  Lab 04/18/21 1159 04/19/21 0734 04/20/21 0535 04/22/21 0706 04/23/21 1039  NA 135 140 141 138 138  K 3.5 2.8* 3.5 4.8 4.3  CL 98 104 107 109 106  CO2 23 29 28 23 26   GLUCOSE 132* 136* 105* 108* 117*  BUN 33* 25* 17 14 15   CREATININE 1.56* 1.05 0.76 0.80 0.93  CALCIUM 10.4* 9.9 10.0 10.3 10.6*   GFR: Estimated Creatinine Clearance: 96.3 mL/min (by C-G formula based on SCr of 0.93 mg/dL). Liver Function Tests: Recent Labs  Lab 04/18/21 0450  AST 24  ALT 29  ALKPHOS 87  BILITOT 1.3*  PROT 7.5  ALBUMIN 4.1   No results for input(s): LIPASE, AMYLASE in the last 168 hours. No results for input(s): AMMONIA in the last 168 hours. Coagulation Profile: Recent Labs  Lab 04/18/21 1753  INR  1.2   Cardiac Enzymes: Recent Labs  Lab 04/18/21 0130  CKTOTAL 393   BNP (last 3 results) No results for input(s): PROBNP in the last 8760 hours. HbA1C: No results for input(s): HGBA1C in the last 72 hours. CBG: Recent Labs  Lab 04/20/21 0833 04/21/21 0758 04/22/21 1122 04/22/21 1658 04/23/21 0815  GLUCAP 96 111* 133* 117* 129*   Lipid Profile: No results for input(s): CHOL, HDL, LDLCALC, TRIG, CHOLHDL, LDLDIRECT in the last 72 hours.  Thyroid Function Tests: No results for input(s): TSH, T4TOTAL, FREET4, T3FREE, THYROIDAB in the last 72 hours. Anemia Panel: No results for input(s): VITAMINB12, FOLATE, FERRITIN, TIBC, IRON, RETICCTPCT in the last 72 hours. Sepsis Labs: Recent Labs  Lab 04/18/21 1114 04/18/21 1413  PROCALCITON 0.13  --   LATICACIDVEN  --  1.8    Recent Results (from the past 240 hour(s))  Resp Panel by RT-PCR (Flu A&B, Covid) Nasopharyngeal Swab     Status: None   Collection Time: 04/18/21 11:14 AM   Specimen: Nasopharyngeal Swab; Nasopharyngeal(NP) swabs in vial transport medium  Result Value Ref Range Status   SARS Coronavirus 2 by RT PCR NEGATIVE NEGATIVE Final    Comment: (NOTE) SARS-CoV-2 target nucleic acids are NOT DETECTED.  The SARS-CoV-2 RNA is generally detectable in upper respiratory specimens during the acute phase of infection. The lowest concentration of SARS-CoV-2 viral copies this assay can detect is 138 copies/mL. A negative result does not preclude SARS-Cov-2 infection and should not be used as the sole basis for treatment or other patient management decisions. A negative result may occur with  improper specimen collection/handling, submission of specimen other than nasopharyngeal swab, presence of viral mutation(s) within the areas targeted by this assay, and inadequate number of viral copies(<138 copies/mL). A negative result must be combined with clinical observations, patient history, and epidemiological information. The  expected result is Negative.  Fact Sheet for Patients:  BloggerCourse.com  Fact Sheet for Healthcare Providers:  SeriousBroker.it  This test is no t yet approved or cleared by the [place] FDA and  has been authorized for detection and/or diagnosis of SARS-CoV-2 by FDA under an Emergency Use Authorization (EUA). This EUA will remain  in effect (meaning this test can be used) for the duration of the COVID-19 declaration under Section 564(b)(1) of the Act,  21 U.S.C.section 360bbb-3(b)(1), unless the authorization is terminated  or revoked sooner.       Influenza A by PCR NEGATIVE NEGATIVE Final   Influenza B by PCR NEGATIVE NEGATIVE Final    Comment: (NOTE) The Xpert Xpress SARS-CoV-2/FLU/RSV plus assay is intended as an aid in the diagnosis of influenza from Nasopharyngeal swab specimens and should not be used as a sole basis for treatment. Nasal washings and aspirates are unacceptable for Xpert Xpress SARS-CoV-2/FLU/RSV testing.  Fact Sheet for Patients: BloggerCourse.com  Fact Sheet for Healthcare Providers: SeriousBroker.it  This test is not yet approved or cleared by the [place] FDA and has been authorized for detection and/or diagnosis of SARS-CoV-2 by FDA under an Emergency Use Authorization (EUA). This EUA will remain in effect (meaning this test can be used) for the duration of the COVID-19 declaration under Section 564(b)(1) of the Act, 21 U.S.C. section 360bbb-3(b)(1), unless the authorization is terminated or revoked.  Performed at [facility], [address] [place], [place] [address]   Culture, blood (x 2)     Status: None   Collection Time: 04/18/21  2:13 PM   Specimen: BLOOD  Result Value Ref Range Status   Specimen Description BLOOD RIGHT ANTECUBITAL  Final   Special Requests   Final    BOTTLES DRAWN AEROBIC AND ANAEROBIC Blood  Culture adequate volume   Culture   Final    NO GROWTH 5 DAYS Performed at [facility], [address], [place], [place] [address]    Report Status 04/23/2021 FINAL  Final  Culture, blood (x 2)     Status: None   Collection Time: 04/18/21  2:55 PM   Specimen: BLOOD  Result Value Ref Range Status   Specimen Description BLOOD BLOOD RIGHT ARM  Final   Special Requests   Final    BOTTLES DRAWN AEROBIC AND ANAEROBIC Blood Culture adequate volume   Culture   Final    NO GROWTH 5 DAYS Performed at [facility], [address], [place], [place] [address]    Report Status 04/23/2021 FINAL  Final      Imaging Studies   No results found.   Medications   Scheduled Meds:  amLODipine  5 mg Oral Daily   apixaban  10 mg Oral BID   Followed by   Melene Muller ON 04/26/2021] apixaban  5 mg Oral BID   atorvastatin  10 mg Oral QHS   carvedilol  25 mg Oral BID   gabapentin  300 mg Oral TID   QUEtiapine  300 mg Oral QHS   Continuous Infusions:     LOS: 0 days    Time spent: 25 minutes with greater than 50% spent at bedside and in coordination of care.     Pennie Banter, DO [facility]  04/23/2021, 1:57 PM      If 7PM-7AM, please contact night-coverage. How to contact the [facility] Attending or Consulting provider 7A - 7P or covering provider during after hours 7P -7A, for this patient?    Check the care team in [facility] and look for a) attending/consulting TRH provider listed and b) the [facility] team listed Log into [email or web address] and use [employer]'s universal password to access. If you do not have the password, please contact the hospital operator. Locate the [facility] provider you are looking for under [facility] and page to a number that you can be directly reached. If you still have difficulty reaching the provider, please page the [facility] (Director on  Call) for the Hospitalists listed on amion for assistance.

## 2021-04-23 NOTE — Progress Notes (Signed)
Patient up in chair worked with therapy.  Patient refusing to allow chair alarm to be placed on.  Patient made aware of fall risk and protocol.  Patient alert and oriented. States, "if it was off when I came in here it should be off when they leave".

## 2021-04-23 NOTE — TOC Progression Note (Signed)
Transition of Care [facility]) - Progression Note    Patient Details  Name: RAVIN DENARDO MRN: [id number] Date of Birth: February 28, 1956  Transition of [facility] CM/SW Contact  Chapman Fitch, RN Phone Number: 04/23/2021, 12:27 PM  Clinical Narrative:     Per Ander Slade with APS, Doristine Mango from the family care home is coming to assess patient today. Bedside RN notified   Expected Discharge Plan: Skilled Nursing Facility Barriers to Discharge: Continued Medical Work up  Expected Discharge Plan and Services Expected Discharge Plan: Skilled Nursing Facility In-house Referral: Clinical Social Work   Post Acute Care Choice: Skilled Nursing Facility Living arrangements for the past 2 months: Single Family Home                                       Social Determinants of Health (SDOH) Interventions    Readmission Risk Interventions No flowsheet data found.

## 2021-04-23 NOTE — Progress Notes (Signed)
Occupational Therapy Treatment Patient Details Name: Michael Alvarado MRN: [id number] DOB: [birth year] Today's Date: 04/23/2021   History of present illness Pt is a 65 y/o M admitted on 04/18/21 with c/c of fall. He refused CT scan of head and neck. Pt also had incidental findings of acute PE & RLE DVT, likely his cause of syncope leading to fall. PMH: dementia, HTN, HLD, DM, GERD, depression, chronic back pain   OT comments  Michael Alvarado was seen for OT treatment on this date. Upon arrival to room pt reclined in bed, agreeable to tx. Pt requires CGA for ADL t/f and ~15 ft in room mobility - pt noted to be furniture walking and not receptive to falls prevention education. CGA for functinoal reaching task manging blinds reaching outside BOS above head height. Pt not receptive to falls prevention education - refuses chair alarm despite RN and OT education. Left with RN in room. Pt making good progress toward goals. Pt continues to benefit from skilled OT services to maximize return to PLOF and minimize risk of future falls, injury, caregiver burden, and readmission. Will continue to follow POC. Discharge recommendation remains appropriate.     Recommendations for follow up therapy are one component of a multi-disciplinary discharge planning process, led by the attending physician.  Recommendations may be updated based on patient status, additional functional criteria and insurance authorization.    Follow Up Recommendations  Home health OT    Assistance Recommended at Discharge Intermittent Supervision/Assistance  Equipment Recommendations  BSC/3in1    Recommendations for Other Services      Precautions / Restrictions Precautions Precautions: Fall Precaution Comments: back precautions for comfort Restrictions Weight Bearing Restrictions: No       Mobility Bed Mobility Overal bed mobility: Needs Assistance Bed Mobility: Supine to Sit     Supine to sit: Supervision;HOB elevated           Transfers Overall transfer level: Needs assistance Equipment used: None Transfers: Sit to/from Stand Sit to Stand: Min guard                 Balance Overall balance assessment: Needs assistance Sitting-balance support: No upper extremity supported;Feet supported Sitting balance-Leahy Scale: Good     Standing balance support: No upper extremity supported;During functional activity Standing balance-Leahy Scale: Fair                             ADL either performed or assessed with clinical judgement   ADL Overall ADL's : Needs assistance/impaired                                       General ADL Comments: CGA for ADL t/f and ~15 ft in room mobility - pt noted to be furniture walking and not receptive to falls prevention education. CGA for functinoal reaching task manging blinds reaching outside BOS above head height.      Cognition Arousal/Alertness: Awake/alert Behavior During Therapy: Flat affect Overall Cognitive Status: No family/caregiver present to determine baseline cognitive functioning                                 General Comments: Pt not receptive to falls prevention education - refuses chair alarm despite RN and OT education.          Exercises  Exercises: Other exercises Other Exercises Other Exercises: Pt educated re: d/c recs, falls prevention Other Exercises: sup>sit, sitting/standing balance/tolerance, ~15 ft mobility           Pertinent Vitals/ Pain       Pain Assessment: 0-10 Pain Score: 10-Worst pain ever Pain Location: back Pain Descriptors / Indicators: Discomfort;Throbbing;Grimacing Pain Intervention(s): Limited activity within patient's tolerance;Repositioned   Frequency  Min 2X/week        Progress Toward Goals  OT Goals(current goals can now be found in the care plan section)     Acute Rehab OT Goals Patient Stated Goal: to be left alone OT Goal Formulation: With  patient Time For Goal Achievement: 05/04/21 Potential to Achieve Goals: Good  Plan Discharge plan remains appropriate;Frequency remains appropriate       AM-PAC OT "6 Clicks" Daily Activity     Outcome Measure   Help from another person eating meals?: A Little Help from another person taking care of personal grooming?: A Little Help from another person toileting, which includes using toliet, bedpan, or urinal?: A Little Help from another person bathing (including washing, rinsing, drying)?: A Little Help from another person to put on and taking off regular upper body clothing?: A Little Help from another person to put on and taking off regular lower body clothing?: A Little 6 Click Score: 18    End of Session    OT Visit Diagnosis: Unsteadiness on feet (R26.81);Repeated falls (R29.6);Muscle weakness (generalized) (M62.81)   Activity Tolerance Patient tolerated treatment well   Patient Left in chair;with nursing/sitter in room (pt refused chair alarm)   Nurse Communication Mobility status        Time: 1638-4665 OT Time Calculation (min): 16 min  Charges: OT General Charges $OT Visit: 1 Visit OT Treatments $Therapeutic Activity: 8-22 mins  Kathie Dike, M.S. OTR/L  04/23/21, 11:14 AM  ascom [phone]

## 2021-04-23 NOTE — Progress Notes (Signed)
Physical Therapy Treatment Patient Details Name: Michael Alvarado MRN: [id number] DOB: 14-Mar-1956 Today's Date: 04/23/2021   History of Present Illness Pt is a 65 y/o M admitted on 04/18/21 with c/c of fall. He refused CT scan of head and neck. Pt also had incidental findings of acute PE & RLE DVT, likely his cause of syncope leading to fall. PMH: dementia, HTN, HLD, DM, GERD, depression, chronic back pain    PT Comments    Patient wakes easily to PT voice, agreeable to PT stating he wants to walk. Did complain of L shoulder pain that seems musculature in nature, as well as chronic back pain. RN notified. Sit <> stand performed several times, CGA - supervision with and without RW, but pt required BUE support each time (furniture if not RW). Able to stand for a few minutes with supervision and place dinner order. Pt did need intermittent repetitive cueing to attend to task. He ambulated ~155ft with RW and CGA, very flexed forward posture, decreased gait speed, and narrow base of support. Returned to room with all needs in reach. The patient would benefit from further skilled PT intervention to continue to progress towards goals. Recommendation remains appropriate.     Recommendations for follow up therapy are one component of a multi-disciplinary discharge planning process, led by the attending physician.  Recommendations may be updated based on patient status, additional functional criteria and insurance authorization.  Follow Up Recommendations  Home health PT     Assistance Recommended at Discharge Intermittent Supervision/Assistance  Equipment Recommendations  Rolling walker (2 wheels);BSC/3in1    Recommendations for Other Services       Precautions / Restrictions Precautions Precautions: Fall Precaution Comments: back precautions for comfort Restrictions Weight Bearing Restrictions: No     Mobility  Bed Mobility Overal bed mobility: Needs Assistance Bed Mobility: Supine to Sit      Supine to sit: Supervision;HOB elevated Sit to supine: Supervision   General bed mobility comments: use of bed rails, extra time & cuing to initiate movement    Transfers Overall transfer level: Needs assistance Equipment used: None;Rolling walker (2 wheels) Transfers: Sit to/from Stand Sit to Stand: Min guard                Ambulation/Gait Ambulation/Gait assistance: Min guard Gait Distance (Feet): 160 Feet Assistive device: Rolling walker (2 wheels) Gait Pattern/deviations: Decreased step length - left;Decreased dorsiflexion - right;Decreased step length - right;Decreased dorsiflexion - left;Decreased stride length;Shuffle Gait velocity: decreased     General Gait Details: decreased step length RLE   Stairs             Wheelchair Mobility    Modified Rankin (Stroke Patients Only)       Balance Overall balance assessment: Needs assistance Sitting-balance support: No upper extremity supported;Feet supported Sitting balance-Leahy Scale: Good     Standing balance support: No upper extremity supported;During functional activity Standing balance-Leahy Scale: Fair Standing balance comment: can stand without UE support with supervision but requires UE support to ambulate safely                            Cognition Arousal/Alertness: Awake/alert Behavior During Therapy: Flat affect Overall Cognitive Status: No family/caregiver present to determine baseline cognitive functioning                                 General Comments: Pt not  receptive to falls prevention education        Exercises      General Comments        Pertinent Vitals/Pain Pain Score: 10-Worst pain ever Pain Location: back, L shoulder Pain Descriptors / Indicators: Discomfort;Throbbing;Grimacing    Home Living                          Prior Function            PT Goals (current goals can now be found in the care plan section)  Progress towards PT goals: Progressing toward goals    Frequency    Min 2X/week      PT Plan Current plan remains appropriate    Co-evaluation              AM-PAC PT "6 Clicks" Mobility   Outcome Measure  Help needed turning from your back to your side while in a flat bed without using bedrails?: None Help needed moving from lying on your back to sitting on the side of a flat bed without using bedrails?: A Little Help needed moving to and from a bed to a chair (including a wheelchair)?: A Little Help needed standing up from a chair using your arms (e.g., wheelchair or bedside chair)?: A Little Help needed to walk in hospital room?: A Little Help needed climbing 3-5 steps with a railing? : A Lot 6 Click Score: 18    End of Session Equipment Utilized During Treatment: Gait belt Activity Tolerance: Patient limited by pain Patient left: in chair;with call bell/phone within reach;with chair alarm set   PT Visit Diagnosis: Unsteadiness on feet (R26.81);Muscle weakness (generalized) (M62.81);Difficulty in walking, not elsewhere classified (R26.2);Pain Pain - part of body:  (back)     Time: 1515-1540 PT Time Calculation (min) (ACUTE ONLY): 25 min  Charges:  $Gait Training: 8-22 mins $Therapeutic Activity: 8-22 mins                     Olga Coaster PT, DPT 3:50 PM,04/23/21

## 2021-04-24 DIAGNOSIS — S80212A Abrasion, left knee, initial encounter: Secondary | ICD-10-CM | POA: Diagnosis present

## 2021-04-24 DIAGNOSIS — E785 Hyperlipidemia, unspecified: Secondary | ICD-10-CM | POA: Diagnosis present

## 2021-04-24 DIAGNOSIS — Z20822 Contact with and (suspected) exposure to covid-19: Secondary | ICD-10-CM | POA: Diagnosis present

## 2021-04-24 DIAGNOSIS — R7303 Prediabetes: Secondary | ICD-10-CM | POA: Diagnosis present

## 2021-04-24 DIAGNOSIS — Z79899 Other long term (current) drug therapy: Secondary | ICD-10-CM | POA: Diagnosis not present

## 2021-04-24 DIAGNOSIS — K219 Gastro-esophageal reflux disease without esophagitis: Secondary | ICD-10-CM | POA: Diagnosis present

## 2021-04-24 DIAGNOSIS — R55 Syncope and collapse: Secondary | ICD-10-CM | POA: Diagnosis present

## 2021-04-24 DIAGNOSIS — I2699 Other pulmonary embolism without acute cor pulmonale: Secondary | ICD-10-CM | POA: Diagnosis present

## 2021-04-24 DIAGNOSIS — G8929 Other chronic pain: Secondary | ICD-10-CM | POA: Diagnosis present

## 2021-04-24 DIAGNOSIS — N179 Acute kidney failure, unspecified: Secondary | ICD-10-CM | POA: Diagnosis present

## 2021-04-24 DIAGNOSIS — S80211A Abrasion, right knee, initial encounter: Secondary | ICD-10-CM | POA: Diagnosis present

## 2021-04-24 DIAGNOSIS — W1830XA Fall on same level, unspecified, initial encounter: Secondary | ICD-10-CM | POA: Diagnosis present

## 2021-04-24 DIAGNOSIS — I10 Essential (primary) hypertension: Secondary | ICD-10-CM | POA: Diagnosis present

## 2021-04-24 DIAGNOSIS — E669 Obesity, unspecified: Secondary | ICD-10-CM | POA: Diagnosis present

## 2021-04-24 DIAGNOSIS — M549 Dorsalgia, unspecified: Secondary | ICD-10-CM | POA: Diagnosis present

## 2021-04-24 DIAGNOSIS — Z6837 Body mass index (BMI) 37.0-37.9, adult: Secondary | ICD-10-CM | POA: Diagnosis not present

## 2021-04-24 DIAGNOSIS — F0393 Unspecified dementia, unspecified severity, with mood disturbance: Secondary | ICD-10-CM | POA: Diagnosis present

## 2021-04-24 DIAGNOSIS — R6510 Systemic inflammatory response syndrome (SIRS) of non-infectious origin without acute organ dysfunction: Secondary | ICD-10-CM | POA: Diagnosis present

## 2021-04-24 DIAGNOSIS — Y92009 Unspecified place in unspecified non-institutional (private) residence as the place of occurrence of the external cause: Secondary | ICD-10-CM | POA: Diagnosis not present

## 2021-04-24 DIAGNOSIS — I82411 Acute embolism and thrombosis of right femoral vein: Secondary | ICD-10-CM | POA: Diagnosis present

## 2021-04-24 DIAGNOSIS — Z5900 Homelessness unspecified: Secondary | ICD-10-CM | POA: Diagnosis not present

## 2021-04-24 DIAGNOSIS — I248 Other forms of acute ischemic heart disease: Secondary | ICD-10-CM | POA: Diagnosis present

## 2021-04-24 DIAGNOSIS — W19XXXA Unspecified fall, initial encounter: Secondary | ICD-10-CM | POA: Diagnosis present

## 2021-04-24 DIAGNOSIS — I712 Thoracic aortic aneurysm, without rupture, unspecified: Secondary | ICD-10-CM | POA: Diagnosis present

## 2021-04-24 DIAGNOSIS — Z8249 Family history of ischemic heart disease and other diseases of the circulatory system: Secondary | ICD-10-CM | POA: Diagnosis not present

## 2021-04-24 DIAGNOSIS — E860 Dehydration: Secondary | ICD-10-CM | POA: Diagnosis present

## 2021-04-24 LAB — BASIC METABOLIC PANEL
Anion gap: 5 (ref 5–15)
BUN: 19 mg/dL (ref 8–23)
CO2: 24 mmol/L (ref 22–32)
Calcium: 10 mg/dL (ref 8.9–10.3)
Chloride: 107 mmol/L (ref 98–111)
Creatinine, Ser: 0.94 mg/dL (ref 0.61–1.24)
GFR, Estimated: >60 mL/min (ref >60)
Glucose, Bld: 100 mg/dL — ABNORMAL HIGH (ref 70–99)
Potassium: 4.6 mmol/L (ref 3.5–5.1)
Sodium: 136 mmol/L (ref 135–145)

## 2021-04-24 LAB — GLUCOSE, CAPILLARY: Glucose-Capillary: 103 mg/dL — ABNORMAL HIGH (ref 70–99)

## 2021-04-24 NOTE — Progress Notes (Signed)
Agree with Edward Jolly, RN assessment.  No significant changes noted.  Will report off to oncoming, no acute distress noted.

## 2021-04-24 NOTE — Plan of Care (Signed)

## 2021-04-24 NOTE — TOC Progression Note (Signed)
Transition of Care [facility]) - Progression Note    Patient Details  Name: Michael Alvarado MRN: [id number] Date of Birth: [birth year]  [facility] CM/SW Contact  Chapman Fitch, RN Phone Number: 04/24/2021, 2:34 PM  Clinical Narrative:     Spoke with APS. Doristine Mango from family care home was unable to come to assess patient yesterday. He will be coming Friday, and is working on applying for the [employer] special assistance program. Per Joy admission would likely occur on Monday   Expected Discharge Plan: Skilled Nursing Facility Barriers to Discharge: Continued Medical Work up  Expected Discharge Plan and Services Expected Discharge Plan: Skilled Nursing Facility In-house Referral: Clinical Social Work   Post Acute Care Choice: Skilled Nursing Facility Living arrangements for the past 2 months: Single Family Home                                       Social Determinants of Health (SDOH) Interventions    Readmission Risk Interventions No flowsheet data found.

## 2021-04-24 NOTE — Progress Notes (Signed)
Physical Therapy Treatment Patient Details Name: Michael Alvarado MRN: [id number] DOB: [birth year] Today's Date: 04/24/2021   History of Present Illness Pt is a 65 y/o M admitted on 04/18/21 with c/c of fall. He refused CT scan of head and neck. Pt also had incidental findings of acute PE & RLE DVT, likely his cause of syncope leading to fall. PMH: dementia, HTN, HLD, DM, GERD, depression, chronic back pain    PT Comments    Pt tolerated treatment well today and was able to improve overall ambulation distance, pain levels, and activity tolerance since last session. Pt required supervision for bed mobility, CGA for transfers, and CGA for gait with RW. Increased cueing for safety during gait, with pt receptiveness improving with time and further education. Able to perform standing marching and multiplanar reaching outside BOS with 1 finger support bil, without LOB demonstrated good weight shift. Pt stating back pain at beginning of session and throughout session, but unable/unwilling to rate pain. Increased c/o "muscle spasm" with walking due to decreased RW proximity and increased UE support on RW; pt educated for RW proximity for these reasons, and noted improved c/o back pain. Despite progress, pt continues to be limited with meeting goals due to poor safety awareness, poor insight into deficits, resistance to education, increased pain levels, and decreased standing balance. Pt appropriate to ambulate with nursing staff while hospitalized, with use of RW and gait belt; pt and RN verbalized understanding. Pt will continue to benefit from skilled acute PT services to address deficits for return to baseline function. Will continue to recommend HHPT with intermittent supervision/assist at DC.     Recommendations for follow up therapy are one component of a multi-disciplinary discharge planning process, led by the attending physician.  Recommendations may be updated based on patient status, additional  functional criteria and insurance authorization.  Follow Up Recommendations  Home health PT     Assistance Recommended at Discharge Intermittent Supervision/Assistance  Equipment Recommendations  Rolling walker (2 wheels);BSC/3in1       Precautions / Restrictions Precautions Precautions: Fall Precaution Comments: back precautions for comfort Restrictions Weight Bearing Restrictions: No     Mobility  Bed Mobility Overal bed mobility: Needs Assistance Bed Mobility: Supine to Sit     Supine to sit: Supervision     General bed mobility comments: use of bed rails, extra time & cuing to initiate movement    Transfers Overall transfer level: Needs assistance Equipment used: Rolling walker (2 wheels) Transfers: Sit to/from Stand Sit to Stand: Min guard Stand pivot transfers: Min guard         General transfer comment: verbal cues for hand placement; receptive towards end of session. leaves RW behind with stand>sit transfer, holding onto furniture    Ambulation/Gait Ambulation/Gait assistance: Min guard Gait Distance (Feet): 320 Feet (141ft x2 with seated rest break) Assistive device: Rolling walker (2 wheels)   Gait velocity: fast     General Gait Details: max verbal cues for RW proximity; decreased RW proximity, forward flexed posture, narrow BOS, decreased step length/foot clearance bil, increased UE WB on RW    Balance Overall balance assessment: Needs assistance Sitting-balance support: No upper extremity supported;Feet supported Sitting balance-Leahy Scale: Good     Standing balance support: During functional activity;Bilateral upper extremity supported Standing balance-Leahy Scale: Fair Standing balance comment: Intermittent episodes of poor without AD and with AD far out infront of pt, improving to fair with RW proximity  Cognition Arousal/Alertness: Awake/alert Behavior During Therapy: Flat affect Overall  Cognitive Status: No family/caregiver present to determine baseline cognitive functioning                                 General Comments: Pt not receptive to falls prevention education        Exercises Other Exercises Other Exercises: Participated in bed mobility, transfers, and gait with RW. Able to perform standing balance marching and multiplanar reaching outside BOS (BUE x10 ea) with unilateral finger support on RW, bilaterally. Other Exercises: Pt educated re: PT role/POC, RW proximity for energy conservation/lung health/back pain/safety. Improved compliance with education at end of session, however, still resistive.        Pertinent Vitals/Pain Pain Assessment: Faces Faces Pain Scale: Hurts even more Pain Location: back Pain Intervention(s): Limited activity within patient's tolerance;Monitored during session;Repositioned     PT Goals (current goals can now be found in the care plan section) Acute Rehab PT Goals Patient Stated Goal: decreased pain PT Goal Formulation: With patient Time For Goal Achievement: 05/04/21 Potential to Achieve Goals: Good Progress towards PT goals: Progressing toward goals    Frequency    Min 2X/week      PT Plan Current plan remains appropriate       AM-PAC PT "6 Clicks" Mobility   Outcome Measure  Help needed turning from your back to your side while in a flat bed without using bedrails?: None Help needed moving from lying on your back to sitting on the side of a flat bed without using bedrails?: A Little Help needed moving to and from a bed to a chair (including a wheelchair)?: A Little Help needed standing up from a chair using your arms (e.g., wheelchair or bedside chair)?: A Little Help needed to walk in hospital room?: A Little Help needed climbing 3-5 steps with a railing? : A Lot 6 Click Score: 18    End of Session Equipment Utilized During Treatment: Gait belt Activity Tolerance: Patient limited by  pain Patient left: in chair;with call bell/phone within reach;with chair alarm set Nurse Communication: Mobility status PT Visit Diagnosis: Unsteadiness on feet (R26.81);Muscle weakness (generalized) (M62.81);Difficulty in walking, not elsewhere classified (R26.2);Pain Pain - part of body:  (back)     Time: 7494-4967 PT Time Calculation (min) (ACUTE ONLY): 25 min  Charges:  $Therapeutic Exercise: 8-22 mins $Therapeutic Activity: 8-22 mins                     Vira Blanco, PT, DPT 1:19 PM,04/24/21

## 2021-04-24 NOTE — TOC Progression Note (Signed)
Transition of Care [facility]) - Progression Note    Patient Details  Name: Michael Alvarado MRN: [id number] Date of Birth: 25-May-1956  [facility] CM/SW Contact  Chapman Fitch, RN Phone Number: 04/24/2021, 2:26 PM  Clinical Narrative:     VM left for DSS Nancy Fetter [phone] to get update on placement.  Awaiting return call   Expected Discharge Plan: Skilled Nursing Facility Barriers to Discharge: Continued Medical Work up  Expected Discharge Plan and Services Expected Discharge Plan: Skilled Nursing Facility In-house Referral: Clinical Social Work   Post Acute Care Choice: Skilled Nursing Facility Living arrangements for the past 2 months: Single Family Home                                       Social Determinants of Health (SDOH) Interventions    Readmission Risk Interventions No flowsheet data found.

## 2021-04-24 NOTE — Progress Notes (Signed)
PROGRESS NOTE    KASYN ROLPH  UEK:[id number] DOB: Jan 11, 1956 DOA: 04/18/2021 PCP: Danella Penton, MD  220A/220A-AA   Assessment & Plan:   Principal Problem:   AKI (acute kidney injury) [facility]) Active Problems:   Dementia (HCC)   Diabetes mellitus without complication (HCC)   HTN (hypertension)   Homeless   Fall at home, initial encounter   Elevated troponin   Hypercalcemia   Chronic back pain   SIRS (systemic inflammatory response syndrome) (HCC)   Depression   Acute pulmonary embolism (HCC)   Pulmonary embolus (HCC)   HAO DION is a 65 y.o. male with medical history significant of homeless, dementia, hypertension, hyperlipidemia, diabetes mellitus, GERD, depression, chronic back pain, who presented to the ED after having a fall while walking for a long time, got tired and dizzy prior to the fall.  He denied LOC or significant injury, and declined CT scans of head and neck in the ED.  Further evaluation found patient to have an acute PE and DVT, AKI, leukoctyosis, mild CK elevation.  Initially treated with heparin, and was transitioned to Eliquis once monitored and remained stable.   Syncope/fall: likely due to pulmonary embolism pt reported after admission that he felt dizzy prior to falling, that it was not a mechanical fall, that he actually passed out.   --PT OT evaluations  --Home health PT recommended, ordered   Acute PE/Right lower extremity DVT: No heart strain evident on CTA chest.   Initially on heparin drip, transitioned to Eliquis.   Has been hemodynamically stable. --Continue Eliquis   Left-sided chest and shoulder pain -reported morning of 11/22.  Troponin normal at 6.   No other ischemic symptoms.  Suspect musculoskeletal etiology   AKI: resolved with IV fluids.   Likely due to dehydration with use of diuretics and Cozaar.   --hold Bumex, HCTZ and Cozaar for now   Dementia: without behavior disturbance.   Baseline cognition unclear, but on my  encounter, appears to have fairly good memory and cognitive processing.   Hypokalemia:  --Monitor and replace as needed   Prediabetes: Recent hemoglobin A1c only 5.8%. Monitor fasting glucose.   Essential hypertension: BP now well controlled.  Presented with very high blood pressure but intermittent low BP --Continue amlodipine and Coreg  --hold Bumex, HCTZ and Cozaar for now   Homelessness: TOC consulted. Per OC, followed by APS for facility placement. "Any and everything concerning this pt's goals of care is dictated by APS. Their social worker w/ Adult Management consultant is working with finding placement and submitting the FL2. For additional information, contact Child psychotherapist w/ DSS Nancy Fetter [phone]" --11/22: Per TOC, a representative from the group home is coming to assess patient    --Patient will need his usual medication refilled as he lost his shoe box containing all of his pill bottles.   Elevated troponin: Very mild elevation and flat.  No chest pain.  Likely secondary to PE and demand ischemia.   SIRS (systemic inflammatory response syndrome) [facility]): Patient meets criteria for SIRS with WBC 14.0, heart rate 95, RR 28, no source of infection identified.  Likely secondary to combination of dehydration and PE. Procalcitonin 0.13.  No antibiotics indicated. No s/sx's of infection.  Monitor   Hypercalcemia: Resolved. Likely from dehydration   Chronic back pain  -switch to tramadol PRN -Continue home Neurontin   Depression  --cont Seroquel   Multiple abrasions/scratch to bilateral knee secondary to fall: Noted.  No signs of infection.  Ascending thoracic aortic aneurysm - seen on CT chest w contrast.  Recommend semi-annual imaging followup by CTA or MRA and referral to cardiothoracic surgery if not already obtained.   Obesity: Body mass index is 37.36 kg/m.  with > 50% spent at bedside and in coordination of care    DVT prophylaxis: AV:WPVXYIA Code Status:  Full code  Family Communication:  Level of care: Med-Surg Dispo:   The patient is from: homeless Anticipated d/c is to: group home Anticipated d/c date is: whenever bed available.  Patient currently is ready to d/c.   Subjective and Interval History:  Pt complained of lower back pain.  Normal oral intake, urination and BM.   Objective: Vitals:   04/24/21 0602 04/24/21 0752 04/24/21 1525 04/24/21 2046  BP: (!) 144/74 134/80 (!) 156/84 135/75  Pulse: 63 63 73 71  Resp: 16 18 18 18   Temp: (!) 97.4 F (36.3 C) 98.9 F (37.2 C) 98.8 F (37.1 C) 98.4 F (36.9 C)  TempSrc: Oral   Oral  SpO2: 100% 99% 100% 99%  Weight:      Height:        Intake/Output Summary (Last 24 hours) at 04/25/2021 04/27/2021 Last data filed at 04/25/2021 0330 Gross per 24 hour  Intake 240 ml  Output 1040 ml  Net -800 ml   Filed Weights   04/18/21 1216  Weight: 114.8 kg    Examination:   Constitutional: NAD, alert, oriented to person and place, sitting in recliner eating meal HEENT: conjunctivae and lids normal, EOMI CV: No cyanosis.   RESP: normal respiratory effort, on RA Neuro: II - XII grossly intact.     Data Reviewed: I have personally reviewed following labs and imaging studies  CBC: Recent Labs  Lab 04/19/21 0734  WBC 8.9  HGB 14.3  HCT 39.4  MCV 90.2  PLT 179   Basic Metabolic Panel: Recent Labs  Lab 04/19/21 0734 04/20/21 0535 04/22/21 0706 04/23/21 1039 04/24/21 0441  NA 140 141 138 138 136  K 2.8* 3.5 4.8 4.3 4.6  CL 104 107 109 106 107  CO2 29 28 23 26 24   GLUCOSE 136* 105* 108* 117* 100*  BUN 25* 17 14 15 19   CREATININE 1.05 0.76 0.80 0.93 0.94  CALCIUM 9.9 10.0 10.3 10.6* 10.0   GFR: Estimated Creatinine Clearance: 95.2 mL/min (by C-G formula based on SCr of 0.94 mg/dL). Liver Function Tests: Recent Labs  Lab 04/18/21 0450  AST 24  ALT 29  ALKPHOS 87  BILITOT 1.3*  PROT 7.5  ALBUMIN 4.1   No results for input(s): LIPASE, AMYLASE in the last 168  hours. No results for input(s): AMMONIA in the last 168 hours. Coagulation Profile: Recent Labs  Lab 04/18/21 1753  INR 1.2   Cardiac Enzymes: No results for input(s): CKTOTAL, CKMB, CKMBINDEX, TROPONINI in the last 168 hours. BNP (last 3 results) No results for input(s): PROBNP in the last 8760 hours. HbA1C: No results for input(s): HGBA1C in the last 72 hours. CBG: Recent Labs  Lab 04/21/21 0758 04/22/21 1122 04/22/21 1658 04/23/21 0815 04/24/21 0752  GLUCAP 111* 133* 117* 129* 103*   Lipid Profile: No results for input(s): CHOL, HDL, LDLCALC, TRIG, CHOLHDL, LDLDIRECT in the last 72 hours. Thyroid Function Tests: No results for input(s): TSH, T4TOTAL, FREET4, T3FREE, THYROIDAB in the last 72 hours. Anemia Panel: No results for input(s): VITAMINB12, FOLATE, FERRITIN, TIBC, IRON, RETICCTPCT in the last 72 hours. Sepsis Labs: Recent Labs  Lab 04/18/21 1114 04/18/21 1413  PROCALCITON 0.13  --   LATICACIDVEN  --  1.8    Recent Results (from the past 240 hour(s))  Resp Panel by RT-PCR (Flu A&B, Covid) Nasopharyngeal Swab     Status: None   Collection Time: 04/18/21 11:14 AM   Specimen: Nasopharyngeal Swab; Nasopharyngeal(NP) swabs in vial transport medium  Result Value Ref Range Status   SARS Coronavirus 2 by RT PCR NEGATIVE NEGATIVE Final    Comment: (NOTE) SARS-CoV-2 target nucleic acids are NOT DETECTED.  The SARS-CoV-2 RNA is generally detectable in upper respiratory specimens during the acute phase of infection. The lowest concentration of SARS-CoV-2 viral copies this assay can detect is 138 copies/mL. A negative result does not preclude SARS-Cov-2 infection and should not be used as the sole basis for treatment or other patient management decisions. A negative result may occur with  improper specimen collection/handling, submission of specimen other than nasopharyngeal swab, presence of viral mutation(s) within the areas targeted by this assay, and inadequate  number of viral copies(<138 copies/mL). A negative result must be combined with clinical observations, patient history, and epidemiological information. The expected result is Negative.  Fact Sheet for Patients:  BloggerCourse.com  Fact Sheet for Healthcare Providers:  SeriousBroker.it  This test is no t yet approved or cleared by the [place] FDA and  has been authorized for detection and/or diagnosis of SARS-CoV-2 by FDA under an Emergency Use Authorization (EUA). This EUA will remain  in effect (meaning this test can be used) for the duration of the COVID-19 declaration under Section 564(b)(1) of the Act, 21 U.S.C.section 360bbb-3(b)(1), unless the authorization is terminated  or revoked sooner.       Influenza A by PCR NEGATIVE NEGATIVE Final   Influenza B by PCR NEGATIVE NEGATIVE Final    Comment: (NOTE) The Xpert Xpress SARS-CoV-2/FLU/RSV plus assay is intended as an aid in the diagnosis of influenza from Nasopharyngeal swab specimens and should not be used as a sole basis for treatment. Nasal washings and aspirates are unacceptable for Xpert Xpress SARS-CoV-2/FLU/RSV testing.  Fact Sheet for Patients: BloggerCourse.com  Fact Sheet for Healthcare Providers: SeriousBroker.it  This test is not yet approved or cleared by the [place] FDA and has been authorized for detection and/or diagnosis of SARS-CoV-2 by FDA under an Emergency Use Authorization (EUA). This EUA will remain in effect (meaning this test can be used) for the duration of the COVID-19 declaration under Section 564(b)(1) of the Act, 21 U.S.C. section 360bbb-3(b)(1), unless the authorization is terminated or revoked.  Performed at [facility], [address] [place], [place] [address]   Culture, blood (x 2)     Status: None   Collection Time: 04/18/21  2:13 PM   Specimen: BLOOD   Result Value Ref Range Status   Specimen Description BLOOD RIGHT ANTECUBITAL  Final   Special Requests   Final    BOTTLES DRAWN AEROBIC AND ANAEROBIC Blood Culture adequate volume   Culture   Final    NO GROWTH 5 DAYS Performed at [facility], [address], [place], [place] [address]    Report Status 04/23/2021 FINAL  Final  Culture, blood (x 2)     Status: None   Collection Time: 04/18/21  2:55 PM   Specimen: BLOOD  Result Value Ref Range Status   Specimen Description BLOOD BLOOD RIGHT ARM  Final   Special Requests   Final    BOTTLES DRAWN AEROBIC AND ANAEROBIC Blood Culture adequate volume   Culture  Final    NO GROWTH 5 DAYS Performed at [facility], [address] [place], [place] [address]    Report Status 04/23/2021 FINAL  Final      Radiology Studies: No results found.   Scheduled Meds:  amLODipine  5 mg Oral Daily   apixaban  10 mg Oral BID   Followed by   Melene Muller ON 04/26/2021] apixaban  5 mg Oral BID   atorvastatin  10 mg Oral QHS   carvedilol  25 mg Oral BID   gabapentin  300 mg Oral TID   QUEtiapine  300 mg Oral QHS   Continuous Infusions:   LOS: 1 day     Darlin Priestly, MD [facility] If 7PM-7AM, please contact night-coverage 04/25/2021, 3:32 AM

## 2021-04-25 DIAGNOSIS — N179 Acute kidney failure, unspecified: Secondary | ICD-10-CM | POA: Diagnosis not present

## 2021-04-25 LAB — GLUCOSE, CAPILLARY: Glucose-Capillary: 98 mg/dL (ref 70–99)

## 2021-04-25 MED ORDER — HYDROCODONE-ACETAMINOPHEN 7.5-325 MG PO TABS: 1.0000 | ORAL_TABLET | Freq: Once | ORAL | Status: DC

## 2021-04-25 MED ORDER — METHOCARBAMOL 500 MG PO TABS
750.0000 mg | ORAL_TABLET | Freq: Four times a day (QID) | ORAL | Status: DC | PRN
  Administered 2021-04-26 – 2021-04-29 (×4): 750 mg via ORAL
  Filled 2021-04-25 (×5): qty 2

## 2021-04-25 MED ORDER — HYDROCODONE-ACETAMINOPHEN 5-325 MG PO TABS
1.0000 | ORAL_TABLET | Freq: Two times a day (BID) | ORAL | Status: DC | PRN
  Administered 2021-04-25 – 2021-04-30 (×8): 1 via ORAL
  Filled 2021-04-25 (×8): qty 1

## 2021-04-25 MED ORDER — TRAMADOL HCL 50 MG PO TABS
50.0000 mg | ORAL_TABLET | Freq: Four times a day (QID) | ORAL | Status: DC | PRN
  Administered 2021-04-25: 50 mg via ORAL
  Filled 2021-04-25: qty 1

## 2021-04-25 MED ORDER — HYDROCODONE-ACETAMINOPHEN 5-325 MG PO TABS
1.0000 | ORAL_TABLET | Freq: Once | ORAL | Status: AC
  Administered 2021-04-26: 1 via ORAL
  Filled 2021-04-25: qty 1

## 2021-04-25 NOTE — Progress Notes (Signed)
Patient is refusing to wait in bed for help. Bed alarm is on and he is irritated when it goes off. Refusing to use walker.

## 2021-04-25 NOTE — Progress Notes (Signed)
PROGRESS NOTE    Michael Alvarado  KTG:[id number] DOB: May 11, 1956 DOA: 04/18/2021 PCP: Danella Penton, MD  220A/220A-AA   Assessment & Plan:   Principal Problem:   AKI (acute kidney injury) [facility]) Active Problems:   Dementia (HCC)   Diabetes mellitus without complication (HCC)   HTN (hypertension)   Homeless   Fall at home, initial encounter   Elevated troponin   Hypercalcemia   Chronic back pain   SIRS (systemic inflammatory response syndrome) (HCC)   Depression   Acute pulmonary embolism (HCC)   Pulmonary embolus (HCC)   Michael Alvarado is a 65 y.o. male with medical history significant of homeless, dementia, hypertension, hyperlipidemia, diabetes mellitus, GERD, depression, chronic back pain, who presented to the ED after having a fall while walking for a long time, got tired and dizzy prior to the fall.  He denied LOC or significant injury, and declined CT scans of head and neck in the ED.  Further evaluation found patient to have an acute PE and DVT, AKI, leukoctyosis, mild CK elevation.  Initially treated with heparin, and was transitioned to Eliquis once monitored and remained stable.   Syncope/fall: likely due to pulmonary embolism pt reported after admission that he felt dizzy prior to falling, that it was not a mechanical fall, that he actually passed out.   --PT OT evaluations  --Home health PT recommended, ordered   Acute PE/Right lower extremity DVT: No heart strain evident on CTA chest.   Initially on heparin drip, transitioned to Eliquis.   Has been hemodynamically stable. --Continue Eliquis   Left-sided chest and shoulder pain -reported morning of 11/22.  Troponin normal at 6.   No other ischemic symptoms.  Suspect musculoskeletal etiology   AKI, POA, resolved --Cr 1.95 on presentation.  Baseline around 0.9.  Likely due to dehydration with use of diuretics and Cozaar.   --hold Bumex, HCTZ and Cozaar for now   Dementia: without behavior disturbance.   Baseline  cognition unclear, but on my encounter, appears to have fairly good memory and cognitive processing.   Hypokalemia:  --Monitor and replace as needed   Prediabetes: Recent hemoglobin A1c only 5.8%. Monitor fasting glucose.   Essential hypertension: BP now well controlled.  Presented with very high blood pressure but intermittent low BP --Continue amlodipine and Coreg  --hold Bumex, HCTZ and Cozaar for now   Homelessness: TOC consulted. Per OC, followed by APS for facility placement. "Any and everything concerning this pt's goals of care is dictated by APS. Their social worker w/ Adult Management consultant is working with finding placement and submitting the FL2. For additional information, contact Child psychotherapist w/ DSS Nancy Fetter [phone]" --11/22: Per TOC, a representative from the group home is coming to assess patient    --Patient will need his usual medication refilled as he lost his shoe box containing all of his pill bottles.   Elevated troponin: Very mild elevation and flat.  No chest pain.  Likely secondary to PE and demand ischemia.   SIRS (systemic inflammatory response syndrome) [facility]): Patient meets criteria for SIRS with WBC 14.0, heart rate 95, RR 28, no source of infection identified.  Likely secondary to combination of dehydration and PE. Procalcitonin 0.13.  No antibiotics indicated. No s/sx's of infection.  Monitor   Hypercalcemia: Resolved. Likely from dehydration   Chronic back pain  --Norco 5 mg BID PRN -Continue home Neurontin   Depression  --cont Seroquel   Multiple abrasions/scratch to bilateral knee secondary to fall: Noted.  No signs of infection.   Ascending thoracic aortic aneurysm - seen on CT chest w contrast.  Recommend semi-annual imaging followup by CTA or MRA and referral to cardiothoracic surgery if not already obtained.   Obesity: Body mass index is 37.36 kg/m.  with > 50% spent at bedside and in coordination of care    DVT prophylaxis:  CH:ENIDPOE Code Status: Full code  Family Communication:  Level of care: Med-Surg Dispo:   The patient is from: homeless Anticipated d/c is to: group home Anticipated d/c date is: whenever bed available.  Patient currently is ready to d/c.   Subjective and Interval History:  Still complained of lower back pain, which is chronic.  Ate his [place] meal today.   Objective: Vitals:   04/24/21 2046 04/25/21 0500 04/25/21 0748 04/25/21 1517  BP: 135/75 124/90 128/79 (!) 108/53  Pulse: 71 63 62 67  Resp: 18 18 16 20   Temp: 98.4 F (36.9 C) 98.4 F (36.9 C) 98.4 F (36.9 C) 98 F (36.7 C)  TempSrc: Oral Oral Oral Oral  SpO2: 99% 99% 99% 98%  Weight:      Height:        Intake/Output Summary (Last 24 hours) at 04/25/2021 1609 Last data filed at 04/25/2021 1412 Gross per 24 hour  Intake 360 ml  Output 250 ml  Net 110 ml   Filed Weights   04/18/21 1216  Weight: 114.8 kg    Examination:   Constitutional: NAD, alert, oriented to person and place HEENT: conjunctivae and lids normal, EOMI CV: No cyanosis.   RESP: normal respiratory effort, on RA Neuro: II - XII grossly intact.   Psych: Normal mood and affect.     Data Reviewed: I have personally reviewed following labs and imaging studies  CBC: Recent Labs  Lab 04/19/21 0734  WBC 8.9  HGB 14.3  HCT 39.4  MCV 90.2  PLT 179   Basic Metabolic Panel: Recent Labs  Lab 04/19/21 0734 04/20/21 0535 04/22/21 0706 04/23/21 1039 04/24/21 0441  NA 140 141 138 138 136  K 2.8* 3.5 4.8 4.3 4.6  CL 104 107 109 106 107  CO2 29 28 23 26 24   GLUCOSE 136* 105* 108* 117* 100*  BUN 25* 17 14 15 19   CREATININE 1.05 0.76 0.80 0.93 0.94  CALCIUM 9.9 10.0 10.3 10.6* 10.0   GFR: Estimated Creatinine Clearance: 95.2 mL/min (by C-G formula based on SCr of 0.94 mg/dL). Liver Function Tests: No results for input(s): AST, ALT, ALKPHOS, BILITOT, PROT, ALBUMIN in the last 168 hours.  No results for input(s): LIPASE, AMYLASE in  the last 168 hours. No results for input(s): AMMONIA in the last 168 hours. Coagulation Profile: Recent Labs  Lab 04/18/21 1753  INR 1.2   Cardiac Enzymes: No results for input(s): CKTOTAL, CKMB, CKMBINDEX, TROPONINI in the last 168 hours. BNP (last 3 results) No results for input(s): PROBNP in the last 8760 hours. HbA1C: No results for input(s): HGBA1C in the last 72 hours. CBG: Recent Labs  Lab 04/22/21 1122 04/22/21 1658 04/23/21 0815 04/24/21 0752 04/25/21 0747  GLUCAP 133* 117* 129* 103* 98   Lipid Profile: No results for input(s): CHOL, HDL, LDLCALC, TRIG, CHOLHDL, LDLDIRECT in the last 72 hours. Thyroid Function Tests: No results for input(s): TSH, T4TOTAL, FREET4, T3FREE, THYROIDAB in the last 72 hours. Anemia Panel: No results for input(s): VITAMINB12, FOLATE, FERRITIN, TIBC, IRON, RETICCTPCT in the last 72 hours. Sepsis Labs: No results for input(s): PROCALCITON, LATICACIDVEN in the last 168  hours.   Recent Results (from the past 240 hour(s))  Resp Panel by RT-PCR (Flu A&B, Covid) Nasopharyngeal Swab     Status: None   Collection Time: 04/18/21 11:14 AM   Specimen: Nasopharyngeal Swab; Nasopharyngeal(NP) swabs in vial transport medium  Result Value Ref Range Status   SARS Coronavirus 2 by RT PCR NEGATIVE NEGATIVE Final    Comment: (NOTE) SARS-CoV-2 target nucleic acids are NOT DETECTED.  The SARS-CoV-2 RNA is generally detectable in upper respiratory specimens during the acute phase of infection. The lowest concentration of SARS-CoV-2 viral copies this assay can detect is 138 copies/mL. A negative result does not preclude SARS-Cov-2 infection and should not be used as the sole basis for treatment or other patient management decisions. A negative result may occur with  improper specimen collection/handling, submission of specimen other than nasopharyngeal swab, presence of viral mutation(s) within the areas targeted by this assay, and inadequate number of  viral copies(<138 copies/mL). A negative result must be combined with clinical observations, patient history, and epidemiological information. The expected result is Negative.  Fact Sheet for Patients:  BloggerCourse.com  Fact Sheet for Healthcare Providers:  SeriousBroker.it  This test is no t yet approved or cleared by the [place] FDA and  has been authorized for detection and/or diagnosis of SARS-CoV-2 by FDA under an Emergency Use Authorization (EUA). This EUA will remain  in effect (meaning this test can be used) for the duration of the COVID-19 declaration under Section 564(b)(1) of the Act, 21 U.S.C.section 360bbb-3(b)(1), unless the authorization is terminated  or revoked sooner.       Influenza A by PCR NEGATIVE NEGATIVE Final   Influenza B by PCR NEGATIVE NEGATIVE Final    Comment: (NOTE) The Xpert Xpress SARS-CoV-2/FLU/RSV plus assay is intended as an aid in the diagnosis of influenza from Nasopharyngeal swab specimens and should not be used as a sole basis for treatment. Nasal washings and aspirates are unacceptable for Xpert Xpress SARS-CoV-2/FLU/RSV testing.  Fact Sheet for Patients: BloggerCourse.com  Fact Sheet for Healthcare Providers: SeriousBroker.it  This test is not yet approved or cleared by the [place] FDA and has been authorized for detection and/or diagnosis of SARS-CoV-2 by FDA under an Emergency Use Authorization (EUA). This EUA will remain in effect (meaning this test can be used) for the duration of the COVID-19 declaration under Section 564(b)(1) of the Act, 21 U.S.C. section 360bbb-3(b)(1), unless the authorization is terminated or revoked.  Performed at [facility], [address] [place], [place] [address]   Culture, blood (x 2)     Status: None   Collection Time: 04/18/21  2:13 PM   Specimen: BLOOD  Result Value  Ref Range Status   Specimen Description BLOOD RIGHT ANTECUBITAL  Final   Special Requests   Final    BOTTLES DRAWN AEROBIC AND ANAEROBIC Blood Culture adequate volume   Culture   Final    NO GROWTH 5 DAYS Performed at [facility], [address], [place], [place] [address]    Report Status 04/23/2021 FINAL  Final  Culture, blood (x 2)     Status: None   Collection Time: 04/18/21  2:55 PM   Specimen: BLOOD  Result Value Ref Range Status   Specimen Description BLOOD BLOOD RIGHT ARM  Final   Special Requests   Final    BOTTLES DRAWN AEROBIC AND ANAEROBIC Blood Culture adequate volume   Culture   Final    NO GROWTH 5 DAYS Performed at [facility], [address] [place], [place] [address]    Report Status 04/23/2021 FINAL  Final      Radiology Studies: No results found.   Scheduled Meds:  amLODipine  5 mg Oral Daily   apixaban  10 mg Oral BID   Followed by   Melene Muller ON 04/26/2021] apixaban  5 mg Oral BID   atorvastatin  10 mg Oral QHS   carvedilol  25 mg Oral BID   gabapentin  300 mg Oral TID   QUEtiapine  300 mg Oral QHS   Continuous Infusions:   LOS: 1 day     Darlin Priestly, MD [facility] If 7PM-7AM, please contact night-coverage 04/25/2021, 4:09 PM

## 2021-04-26 DIAGNOSIS — N179 Acute kidney failure, unspecified: Secondary | ICD-10-CM | POA: Diagnosis not present

## 2021-04-26 LAB — GLUCOSE, CAPILLARY: Glucose-Capillary: 98 mg/dL (ref 70–99)

## 2021-04-26 NOTE — Progress Notes (Signed)
Physical Therapy Treatment Patient Details Name: Michael Alvarado MRN: [id number] DOB: [birth year] Today's Date: 04/26/2021   History of Present Illness Pt is a 65 y/o M admitted on 04/18/21 with c/c of fall. He refused CT scan of head and neck. Pt also had incidental findings of acute PE & RLE DVT, likely his cause of syncope leading to fall. PMH: dementia, HTN, HLD, DM, GERD, depression, chronic back pain    PT Comments    Pt tolerated treatment fair today, limited secondary to increased back pain. Pt continues to be resistive to education regarding: pain management with mobility, safety with OOB mobility, use of RW, and bed alarm for reduced fall risk. Pt required supervision for safety with bed mobility and transfers, and CGA for safety with gait. Increased cueing for safety/pain management during gait, however, pt unwilling/able to comply when directed demonstrating increased agitation. Improved balance and quality of gait with improved RW proximity. Assisted pt to bathroom for voiding; pt able to stand to use bathroom with UUE support on handrail, but required assist for brief management. Increased pain noted with heel slides, marching, and LAQ likely due to erector spinae activation for stabilization. Educated pt for discontinuation of exercise due to increased pain levels/pain behaviors, however, pt declining stating that "pain makes it better." Pt provided education regarding pain science and that pain does not "make it better", however, pt resistive. Pt making progress towards goals as he was able to improve assist levels and activity tolerance today. However, continues to be limited with meeting goals due to poor safety awareness, impaired insight into deficits, agitation, increased pain levels, decreased standing balance, and decreased activity tolerance. Pt will continue to benefit from skilled acute PT services to address deficits for return to baseline function. Will continue to recommend HHPT  at DC with intermittent supervision/assist.      Recommendations for follow up therapy are one component of a multi-disciplinary discharge planning process, led by the attending physician.  Recommendations may be updated based on patient status, additional functional criteria and insurance authorization.  Follow Up Recommendations  Home health PT     Assistance Recommended at Discharge Intermittent Supervision/Assistance  Equipment Recommendations  Rolling walker (2 wheels);BSC/3in1       Precautions / Restrictions Precautions Precautions: Fall Precaution Comments: back precautions for comfort     Mobility  Bed Mobility Overal bed mobility: Needs Assistance Bed Mobility: Supine to Sit     Supine to sit: Supervision Sit to supine: Supervision   General bed mobility comments: use of bed rails, extra time & cuing to initiate movement    Transfers Overall transfer level: Needs assistance Equipment used: Rolling walker (2 wheels);None Transfers: Sit to/from Stand Sit to Stand: Supervision           General transfer comment: supervision for safety to perform STS from EOB with and without RW; noted to be reaching outside BOS for sink to pull to stand, despite cues for proper hand placement for transfer    Ambulation/Gait Ambulation/Gait assistance: Min guard Gait Distance (Feet): 260 Feet Assistive device: Rolling walker (2 wheels)         General Gait Details: max verbal cues for RW proximity for pain management; decreased RW proximity, forward flexed posture, narrow BOS, decreased step length/foot clearance bil, increased UE WB on RW     Balance Overall balance assessment: Needs assistance Sitting-balance support: No upper extremity supported;Feet supported Sitting balance-Leahy Scale: Good     Standing balance support: During functional activity;Bilateral upper  extremity supported Standing balance-Leahy Scale: Fair Standing balance comment: Intermittent  episodes of poor without AD and with AD far out infront of pt, improving to fair with RW proximity                            Cognition Arousal/Alertness: Awake/alert Behavior During Therapy: Flat affect Overall Cognitive Status: No family/caregiver present to determine baseline cognitive functioning                                 General Comments: Pt not receptive to falls prevention, pain management, and safety education        Exercises Other Exercises Other Exercises: Participated in bed mobility, transfers, toileting, and gait with RW. Performed x10 BLE therex including: ankle pumps, LAQ, hip ABD/ADD, and heel slides. Attempted marching but caused increased LBP. Increased LBP stated with heel slides and LAQ; pt educated pt to stop, but he declined stating "pain makes it better" Other Exercises: Pt educated re: PT role/POC, RW proximity for energy conservation/lung health/back pain/safety, pain management, safety with mobility. Declining education of pain management, stating "pain makes it better"        Pertinent Vitals/Pain Pain Assessment: 0-10 Pain Score: 8  Pain Location: back Pain Descriptors / Indicators: Grimacing Pain Intervention(s): Limited activity within patient's tolerance;Monitored during session;Patient requesting pain meds-RN notified;Repositioned     PT Goals (current goals can now be found in the care plan section) Acute Rehab PT Goals Patient Stated Goal: decreased pain PT Goal Formulation: With patient Time For Goal Achievement: 05/04/21 Potential to Achieve Goals: Good Progress towards PT goals: Progressing toward goals    Frequency    Min 2X/week      PT Plan Current plan remains appropriate       AM-PAC PT "6 Clicks" Mobility   Outcome Measure  Help needed turning from your back to your side while in a flat bed without using bedrails?: None Help needed moving from lying on your back to sitting on the side of a  flat bed without using bedrails?: A Little Help needed moving to and from a bed to a chair (including a wheelchair)?: A Little Help needed standing up from a chair using your arms (e.g., wheelchair or bedside chair)?: A Little Help needed to walk in hospital room?: A Little Help needed climbing 3-5 steps with a railing? : A Lot 6 Click Score: 18    End of Session Equipment Utilized During Treatment: Gait belt Activity Tolerance: Patient limited by pain Patient left: with call bell/phone within reach;in bed;with bed alarm set Nurse Communication: Mobility status PT Visit Diagnosis: Unsteadiness on feet (R26.81);Muscle weakness (generalized) (M62.81);Difficulty in walking, not elsewhere classified (R26.2);Pain Pain - part of body:  (back)     Time: 2992-4268 PT Time Calculation (min) (ACUTE ONLY): 29 min  Charges:  $Gait Training: 8-22 mins $Therapeutic Exercise: 8-22 mins                      Vira Blanco, PT, DPT 10:20 AM,04/26/21

## 2021-04-26 NOTE — Progress Notes (Signed)
Patient continues to refuse to wait for help to get out of bed. Gets agitated when alarm goes off and refuses to use walker. Nursing continues to educate patient on fall risks.

## 2021-04-26 NOTE — Progress Notes (Signed)
Occupational Therapy Treatment Patient Details Name: Michael Alvarado MRN: [id number] DOB: 08/23/1955 Today's Date: 04/26/2021   History of present illness Pt is a 65 y/o M admitted on 04/18/21 with c/c of fall. He refused CT scan of head and neck. Pt also had incidental findings of acute PE & RLE DVT, likely his cause of syncope leading to fall. PMH: dementia, HTN, HLD, DM, GERD, depression, chronic back pain   OT comments  Michael Alvarado seen for OT treatment on this date. Upon arrival to room pt awake/alert. Pt seated in bed and agreeable to tx. Pt instructed in HEP and return demonstrated exercises. Pt completed HEP with green theraband (provided) at EOB including chest pulls x10, biceps R& L x10, triceps R& L x10, and overhead pulls x10. Pt making good progress toward goals. Pt continues to benefit from skilled OT services to maximize return to PLOF and minimize risk of future falls, injury, caregiver burden, and readmission. Will continue to follow POC. Discharge recommendation remains appropriate.     Recommendations for follow up therapy are one component of a multi-disciplinary discharge planning process, led by the attending physician.  Recommendations may be updated based on patient status, additional functional criteria and insurance authorization.    Follow Up Recommendations  Home health OT    Assistance Recommended at Discharge Intermittent Supervision/Assistance  Equipment Recommendations  BSC/3in1    Recommendations for Other Services      Precautions / Restrictions Precautions Precautions: Fall Precaution Comments: back precautions for comfort       Mobility Bed Mobility Overal bed mobility: Needs Assistance Bed Mobility: Supine to Sit     Supine to sit: Supervision Sit to supine: Supervision   General bed mobility comments: Uses bed rails, extra time    Transfers                         Balance   Sitting-balance support: No upper extremity  supported;Feet supported Sitting balance-Leahy Scale: Good                                     ADL either performed or assessed with clinical judgement   ADL                                                Cognition Arousal/Alertness: Awake/alert Behavior During Therapy: WFL for tasks assessed/performed Overall Cognitive Status: No family/caregiver present to determine baseline cognitive functioning                                            Exercises Exercises: Other exercises Other Exercises Other Exercises: Pt educ re: importance of mvmt for functional mobiity, OT role vs PT role, HEP program Other Exercises: Sup<>sit, EOB exercises - chest pulls x10, biceps/triceps R& L x10, overhead pulls x10   Shoulder Instructions       General Comments      Pertinent Vitals/ Pain       Pain Assessment: Faces Faces Pain Scale: Hurts even more Pain Location: back Pain Descriptors / Indicators: Grimacing Pain Intervention(s): Limited activity within patient's tolerance;Monitored during session;Repositioned  Home Living  Prior Functioning/Environment              Frequency  Min 2X/week        Progress Toward Goals  OT Goals(current goals can now be found in the care plan section)  Progress towards OT goals: Progressing toward goals  Acute Rehab OT Goals Patient Stated Goal: Get better OT Goal Formulation: With patient Time For Goal Achievement: 05/04/21 Potential to Achieve Goals: Good ADL Goals Pt Will Perform Lower Body Dressing: with supervision Pt Will Transfer to Toilet: with min guard assist  Plan Discharge plan remains appropriate;Frequency remains appropriate    Co-evaluation                 AM-PAC OT "6 Clicks" Daily Activity     Outcome Measure   Help from another person eating meals?: None Help from another person taking care of  personal grooming?: A Little Help from another person toileting, which includes using toliet, bedpan, or urinal?: A Little Help from another person bathing (including washing, rinsing, drying)?: A Little Help from another person to put on and taking off regular upper body clothing?: None Help from another person to put on and taking off regular lower body clothing?: A Little 6 Click Score: 20    End of Session Equipment Utilized During Treatment: Other (comment) (theraband - green)  OT Visit Diagnosis: Unsteadiness on feet (R26.81);Repeated falls (R29.6);Muscle weakness (generalized) (M62.81)   Activity Tolerance Patient tolerated treatment well   Patient Left in bed;with call bell/phone within reach;with bed alarm set   Nurse Communication          Time: 6720-9470 OT Time Calculation (min): 13 min  Charges: OT General Charges $OT Visit: 1 Visit OT Treatments $Therapeutic Exercise: 8-22 mins [place] [employer], Adine Madura 04/26/2021, 3:24 PM

## 2021-04-26 NOTE — Progress Notes (Signed)
PROGRESS NOTE    Michael Alvarado  YKD:[id number] DOB: May 27, 1956 DOA: 04/18/2021 PCP: Danella Penton, MD  220A/220A-AA   Assessment & Plan:   Principal Problem:   AKI (acute kidney injury) [facility]) Active Problems:   Dementia (HCC)   Diabetes mellitus without complication (HCC)   HTN (hypertension)   Homeless   Fall at home, initial encounter   Elevated troponin   Hypercalcemia   Chronic back pain   SIRS (systemic inflammatory response syndrome) (HCC)   Depression   Acute pulmonary embolism (HCC)   Pulmonary embolus (HCC)   Michael Alvarado is a 65 y.o. male with medical history significant of homeless, dementia, hypertension, hyperlipidemia, diabetes mellitus, GERD, depression, chronic back pain, who presented to the ED after having a fall while walking for a long time, got tired and dizzy prior to the fall.  He denied LOC or significant injury, and declined CT scans of head and neck in the ED.  Further evaluation found patient to have an acute PE and DVT, AKI, leukoctyosis, mild CK elevation.  Initially treated with heparin, and was transitioned to Eliquis once monitored and remained stable.   Syncope/fall: likely due to pulmonary embolism pt reported after admission that he felt dizzy prior to falling, that it was not a mechanical fall, that he actually passed out.   --PT OT evaluations  --Home health PT recommended, ordered   Acute PE/Right lower extremity DVT: No heart strain evident on CTA chest.   Initially on heparin drip, transitioned to Eliquis.   Has been hemodynamically stable. --Continue Eliquis   Left-sided chest and shoulder pain -reported morning of 11/22.  Troponin normal at 6.   No other ischemic symptoms.  Suspect musculoskeletal etiology   AKI, POA, resolved --Cr 1.95 on presentation.  Baseline around 0.9.  Likely due to dehydration with use of diuretics and Cozaar.   --hold Bumex, HCTZ and Cozaar for now   Dementia: without behavior disturbance.   Baseline  cognition unclear, but on my encounter, appears to have fairly good memory and cognitive processing.   Hypokalemia:  --Monitor and replace as needed   Prediabetes: Recent hemoglobin A1c only 5.8%. Monitor fasting glucose.   Essential hypertension: BP now well controlled.  Presented with very high blood pressure but intermittent low BP --Continue amlodipine and Coreg  --hold Bumex, HCTZ and Cozaar for now   Homelessness: TOC consulted. Per OC, followed by APS for facility placement. "Any and everything concerning this pt's goals of care is dictated by APS. Their social worker w/ Adult Management consultant is working with finding placement and submitting the FL2. For additional information, contact Child psychotherapist w/ DSS Nancy Fetter [phone]" --11/22: Per TOC, a representative from the group home is coming to assess patient  --Patient will need his usual medication refilled as he lost his shoe box containing all of his pill bottles.   Elevated troponin: Very mild elevation and flat.  No chest pain.  Likely secondary to PE and demand ischemia.   SIRS (systemic inflammatory response syndrome) [facility]): Patient meets criteria for SIRS with WBC 14.0, heart rate 95, RR 28, no source of infection identified.  Likely secondary to combination of dehydration and PE. Procalcitonin 0.13.  No antibiotics indicated. No s/sx's of infection.  Monitor   Hypercalcemia: Resolved. Likely from dehydration   Chronic back pain  --Norco 5 mg BID PRN -Continue home Neurontin   Depression  --cont Seroquel   Multiple abrasions/scratch to bilateral knee secondary to fall: Noted.  No  signs of infection.   Ascending thoracic aortic aneurysm - seen on CT chest w contrast.  Recommend semi-annual imaging followup by CTA or MRA and referral to cardiothoracic surgery if not already obtained.   Obesity: Body mass index is 37.36 kg/m.  with > 50% spent at bedside and in coordination of care    DVT prophylaxis:  IE:PPIRJJO Code Status: Full code  Family Communication:  Level of care: Med-Surg Dispo:   The patient is from: homeless Anticipated d/c is to: group home Anticipated d/c date is: whenever bed available.  Patient currently is ready to d/c.   Subjective and Interval History:  Pt had no new complaints.  Pt was watching TV and not interested in talking.   Objective: Vitals:   04/25/21 2009 04/26/21 0440 04/26/21 0845 04/26/21 1533  BP: (!) 146/94 129/88 122/72 136/80  Pulse: 79 62 66 66  Resp: 18 16 16 18   Temp: 98.5 F (36.9 C) 98.1 F (36.7 C) 98.3 F (36.8 C) 98.2 F (36.8 C)  TempSrc:  Oral Oral Oral  SpO2: 100% 97% 100% 100%  Weight:      Height:        Intake/Output Summary (Last 24 hours) at 04/26/2021 1617 Last data filed at 04/26/2021 1412 Gross per 24 hour  Intake 720 ml  Output 200 ml  Net 520 ml   Filed Weights   04/18/21 1216  Weight: 114.8 kg    Examination:   Constitutional: NAD, alert, oriented to person and place HEENT: conjunctivae and lids normal, EOMI CV: No cyanosis.   RESP: normal respiratory effort, on RA Neuro: II - XII grossly intact.   Psych: flat mood and affect.     Data Reviewed: I have personally reviewed following labs and imaging studies  CBC: No results for input(s): WBC, NEUTROABS, HGB, HCT, MCV, PLT in the last 168 hours.  Basic Metabolic Panel: Recent Labs  Lab 04/20/21 0535 04/22/21 0706 04/23/21 1039 04/24/21 0441  NA 141 138 138 136  K 3.5 4.8 4.3 4.6  CL 107 109 106 107  CO2 28 23 26 24   GLUCOSE 105* 108* 117* 100*  BUN 17 14 15 19   CREATININE 0.76 0.80 0.93 0.94  CALCIUM 10.0 10.3 10.6* 10.0   GFR: Estimated Creatinine Clearance: 95.2 mL/min (by C-G formula based on SCr of 0.94 mg/dL). Liver Function Tests: No results for input(s): AST, ALT, ALKPHOS, BILITOT, PROT, ALBUMIN in the last 168 hours.  No results for input(s): LIPASE, AMYLASE in the last 168 hours. No results for input(s): AMMONIA in the  last 168 hours. Coagulation Profile: No results for input(s): INR, PROTIME in the last 168 hours.  Cardiac Enzymes: No results for input(s): CKTOTAL, CKMB, CKMBINDEX, TROPONINI in the last 168 hours. BNP (last 3 results) No results for input(s): PROBNP in the last 8760 hours. HbA1C: No results for input(s): HGBA1C in the last 72 hours. CBG: Recent Labs  Lab 04/22/21 1658 04/23/21 0815 04/24/21 0752 04/25/21 0747 04/26/21 0840  GLUCAP 117* 129* 103* 98 98   Lipid Profile: No results for input(s): CHOL, HDL, LDLCALC, TRIG, CHOLHDL, LDLDIRECT in the last 72 hours. Thyroid Function Tests: No results for input(s): TSH, T4TOTAL, FREET4, T3FREE, THYROIDAB in the last 72 hours. Anemia Panel: No results for input(s): VITAMINB12, FOLATE, FERRITIN, TIBC, IRON, RETICCTPCT in the last 72 hours. Sepsis Labs: No results for input(s): PROCALCITON, LATICACIDVEN in the last 168 hours.   Recent Results (from the past 240 hour(s))  Resp Panel by RT-PCR (Flu A&B,  Covid) Nasopharyngeal Swab     Status: None   Collection Time: 04/18/21 11:14 AM   Specimen: Nasopharyngeal Swab; Nasopharyngeal(NP) swabs in vial transport medium  Result Value Ref Range Status   SARS Coronavirus 2 by RT PCR NEGATIVE NEGATIVE Final    Comment: (NOTE) SARS-CoV-2 target nucleic acids are NOT DETECTED.  The SARS-CoV-2 RNA is generally detectable in upper respiratory specimens during the acute phase of infection. The lowest concentration of SARS-CoV-2 viral copies this assay can detect is 138 copies/mL. A negative result does not preclude SARS-Cov-2 infection and should not be used as the sole basis for treatment or other patient management decisions. A negative result may occur with  improper specimen collection/handling, submission of specimen other than nasopharyngeal swab, presence of viral mutation(s) within the areas targeted by this assay, and inadequate number of viral copies(<138 copies/mL). A negative  result must be combined with clinical observations, patient history, and epidemiological information. The expected result is Negative.  Fact Sheet for Patients:  BloggerCourse.com  Fact Sheet for Healthcare Providers:  SeriousBroker.it  This test is no t yet approved or cleared by the [place] FDA and  has been authorized for detection and/or diagnosis of SARS-CoV-2 by FDA under an Emergency Use Authorization (EUA). This EUA will remain  in effect (meaning this test can be used) for the duration of the COVID-19 declaration under Section 564(b)(1) of the Act, 21 U.S.C.section 360bbb-3(b)(1), unless the authorization is terminated  or revoked sooner.       Influenza A by PCR NEGATIVE NEGATIVE Final   Influenza B by PCR NEGATIVE NEGATIVE Final    Comment: (NOTE) The Xpert Xpress SARS-CoV-2/FLU/RSV plus assay is intended as an aid in the diagnosis of influenza from Nasopharyngeal swab specimens and should not be used as a sole basis for treatment. Nasal washings and aspirates are unacceptable for Xpert Xpress SARS-CoV-2/FLU/RSV testing.  Fact Sheet for Patients: BloggerCourse.com  Fact Sheet for Healthcare Providers: SeriousBroker.it  This test is not yet approved or cleared by the [place] FDA and has been authorized for detection and/or diagnosis of SARS-CoV-2 by FDA under an Emergency Use Authorization (EUA). This EUA will remain in effect (meaning this test can be used) for the duration of the COVID-19 declaration under Section 564(b)(1) of the Act, 21 U.S.C. section 360bbb-3(b)(1), unless the authorization is terminated or revoked.  Performed at [facility], [address] [place], [place] [address]   Culture, blood (x 2)     Status: None   Collection Time: 04/18/21  2:13 PM   Specimen: BLOOD  Result Value Ref Range Status   Specimen Description  BLOOD RIGHT ANTECUBITAL  Final   Special Requests   Final    BOTTLES DRAWN AEROBIC AND ANAEROBIC Blood Culture adequate volume   Culture   Final    NO GROWTH 5 DAYS Performed at [facility], [address] [place], [place] [address]    Report Status 04/23/2021 FINAL  Final  Culture, blood (x 2)     Status: None   Collection Time: 04/18/21  2:55 PM   Specimen: BLOOD  Result Value Ref Range Status   Specimen Description BLOOD BLOOD RIGHT ARM  Final   Special Requests   Final    BOTTLES DRAWN AEROBIC AND ANAEROBIC Blood Culture adequate volume   Culture   Final    NO GROWTH 5 DAYS Performed at [facility], [address], [place], [place] [address]    Report Status 04/23/2021 FINAL  Final      Radiology Studies: No results found.   Scheduled Meds:  amLODipine  5 mg Oral Daily   apixaban  5 mg Oral BID   atorvastatin  10 mg Oral QHS   carvedilol  25 mg Oral BID   gabapentin  300 mg Oral TID   QUEtiapine  300 mg Oral QHS   Continuous Infusions:   LOS: 2 days     Darlin Priestly, MD [facility] If 7PM-7AM, please contact night-coverage 04/26/2021, 4:17 PM

## 2021-04-27 DIAGNOSIS — N179 Acute kidney failure, unspecified: Secondary | ICD-10-CM | POA: Diagnosis not present

## 2021-04-27 LAB — GLUCOSE, CAPILLARY: Glucose-Capillary: 100 mg/dL — ABNORMAL HIGH (ref 70–99)

## 2021-04-27 NOTE — Progress Notes (Signed)
PROGRESS NOTE    Michael Alvarado  YKD:[id number] DOB: May 27, 1956 DOA: 04/18/2021 PCP: Danella Penton, MD  220A/220A-AA   Assessment & Plan:   Principal Problem:   AKI (acute kidney injury) [facility]) Active Problems:   Dementia (HCC)   Diabetes mellitus without complication (HCC)   HTN (hypertension)   Homeless   Fall at home, initial encounter   Elevated troponin   Hypercalcemia   Chronic back pain   SIRS (systemic inflammatory response syndrome) (HCC)   Depression   Acute pulmonary embolism (HCC)   Pulmonary embolus (HCC)   Michael Alvarado is a 65 y.o. male with medical history significant of homeless, dementia, hypertension, hyperlipidemia, diabetes mellitus, GERD, depression, chronic back pain, who presented to the ED after having a fall while walking for a long time, got tired and dizzy prior to the fall.  He denied LOC or significant injury, and declined CT scans of head and neck in the ED.  Further evaluation found patient to have an acute PE and DVT, AKI, leukoctyosis, mild CK elevation.  Initially treated with heparin, and was transitioned to Eliquis once monitored and remained stable.   Syncope/fall: likely due to pulmonary embolism pt reported after admission that he felt dizzy prior to falling, that it was not a mechanical fall, that he actually passed out.   --PT OT evaluations  --Home health PT recommended, ordered   Acute PE/Right lower extremity DVT: No heart strain evident on CTA chest.   Initially on heparin drip, transitioned to Eliquis.   Has been hemodynamically stable. --Continue Eliquis   Left-sided chest and shoulder pain -reported morning of 11/22.  Troponin normal at 6.   No other ischemic symptoms.  Suspect musculoskeletal etiology   AKI, POA, resolved --Cr 1.95 on presentation.  Baseline around 0.9.  Likely due to dehydration with use of diuretics and Cozaar.   --hold Bumex, HCTZ and Cozaar for now   Dementia: without behavior disturbance.   Baseline  cognition unclear, but on my encounter, appears to have fairly good memory and cognitive processing.   Hypokalemia:  --Monitor and replace as needed   Prediabetes: Recent hemoglobin A1c only 5.8%. Monitor fasting glucose.   Essential hypertension: BP now well controlled.  Presented with very high blood pressure but intermittent low BP --Continue amlodipine and Coreg  --hold Bumex, HCTZ and Cozaar for now   Homelessness: TOC consulted. Per OC, followed by APS for facility placement. "Any and everything concerning this pt's goals of care is dictated by APS. Their social worker w/ Adult Management consultant is working with finding placement and submitting the FL2. For additional information, contact Child psychotherapist w/ DSS Nancy Fetter [phone]" --11/22: Per TOC, a representative from the group home is coming to assess patient  --Patient will need his usual medication refilled as he lost his shoe box containing all of his pill bottles.   Elevated troponin: Very mild elevation and flat.  No chest pain.  Likely secondary to PE and demand ischemia.   SIRS (systemic inflammatory response syndrome) [facility]): Patient meets criteria for SIRS with WBC 14.0, heart rate 95, RR 28, no source of infection identified.  Likely secondary to combination of dehydration and PE. Procalcitonin 0.13.  No antibiotics indicated. No s/sx's of infection.  Monitor   Hypercalcemia: Resolved. Likely from dehydration   Chronic back pain  --Norco 5 mg BID PRN -Continue home Neurontin   Depression  --cont Seroquel   Multiple abrasions/scratch to bilateral knee secondary to fall: Noted.  No  signs of infection.   Ascending thoracic aortic aneurysm - seen on CT chest w contrast.  Recommend semi-annual imaging followup by CTA or MRA and referral to cardiothoracic surgery if not already obtained.   Obesity: Body mass index is 37.36 kg/m.  with > 50% spent at bedside and in coordination of care    DVT prophylaxis:  VV:[id number] Code Status: Full code  Family Communication:  Level of care: Med-Surg Dispo:   The patient is from: homeless Anticipated d/c is to: group home Anticipated d/c date is: whenever bed available.  Patient currently is ready to d/c.   Subjective and Interval History:  The only consistent ROS was back pain.  Pt was sarcastic with most answers.   Objective: Vitals:   04/26/21 2011 04/27/21 0422 04/27/21 0722 04/27/21 1617  BP: 137/77 114/71 134/82 126/75  Pulse: 86 68 60 66  Resp: 16 16 20 18   Temp: 97.8 F (36.6 C) 98.7 F (37.1 C) 98.4 F (36.9 C) 98.9 F (37.2 C)  TempSrc: Oral Oral Oral Oral  SpO2: 100% 100% 97% 98%  Weight:      Height:        Intake/Output Summary (Last 24 hours) at 04/27/2021 1634 Last data filed at 04/27/2021 1300 Gross per 24 hour  Intake 180 ml  Output 900 ml  Net -720 ml   Filed Weights   04/18/21 1216  Weight: 114.8 kg    Examination:   Constitutional: NAD, alert, oriented to person and place HEENT: conjunctivae and lids normal, EOMI CV: No cyanosis.   RESP: normal respiratory effort, on RA Extremities: No effusions, edema in BLE SKIN: warm, dry, superficial abrasions over legs Neuro: II - XII grossly intact.   Psych: sarcastic mood and affect.     Data Reviewed: I have personally reviewed following labs and imaging studies  CBC: No results for input(s): WBC, NEUTROABS, HGB, HCT, MCV, PLT in the last 168 hours.  Basic Metabolic Panel: Recent Labs  Lab 04/22/21 0706 04/23/21 1039 04/24/21 0441  NA 138 138 136  K 4.8 4.3 4.6  CL 109 106 107  CO2 23 26 24   GLUCOSE 108* 117* 100*  BUN 14 15 19   CREATININE 0.80 0.93 0.94  CALCIUM 10.3 10.6* 10.0   GFR: Estimated Creatinine Clearance: 95.2 mL/min (by C-G formula based on SCr of 0.94 mg/dL). Liver Function Tests: No results for input(s): AST, ALT, ALKPHOS, BILITOT, PROT, ALBUMIN in the last 168 hours.  No results for input(s): LIPASE, AMYLASE in the last 168  hours. No results for input(s): AMMONIA in the last 168 hours. Coagulation Profile: No results for input(s): INR, PROTIME in the last 168 hours.  Cardiac Enzymes: No results for input(s): CKTOTAL, CKMB, CKMBINDEX, TROPONINI in the last 168 hours. BNP (last 3 results) No results for input(s): PROBNP in the last 8760 hours. HbA1C: No results for input(s): HGBA1C in the last 72 hours. CBG: Recent Labs  Lab 04/23/21 0815 04/24/21 0752 04/25/21 0747 04/26/21 0840 04/27/21 0718  GLUCAP 129* 103* 98 98 100*   Lipid Profile: No results for input(s): CHOL, HDL, LDLCALC, TRIG, CHOLHDL, LDLDIRECT in the last 72 hours. Thyroid Function Tests: No results for input(s): TSH, T4TOTAL, FREET4, T3FREE, THYROIDAB in the last 72 hours. Anemia Panel: No results for input(s): VITAMINB12, FOLATE, FERRITIN, TIBC, IRON, RETICCTPCT in the last 72 hours. Sepsis Labs: No results for input(s): PROCALCITON, LATICACIDVEN in the last 168 hours.   Recent Results (from the past 240 hour(s))  Resp Panel by RT-PCR (Flu  A&B, Covid) Nasopharyngeal Swab     Status: None   Collection Time: 04/18/21 11:14 AM   Specimen: Nasopharyngeal Swab; Nasopharyngeal(NP) swabs in vial transport medium  Result Value Ref Range Status   SARS Coronavirus 2 by RT PCR NEGATIVE NEGATIVE Final    Comment: (NOTE) SARS-CoV-2 target nucleic acids are NOT DETECTED.  The SARS-CoV-2 RNA is generally detectable in upper respiratory specimens during the acute phase of infection. The lowest concentration of SARS-CoV-2 viral copies this assay can detect is 138 copies/mL. A negative result does not preclude SARS-Cov-2 infection and should not be used as the sole basis for treatment or other patient management decisions. A negative result may occur with  improper specimen collection/handling, submission of specimen other than nasopharyngeal swab, presence of viral mutation(s) within the areas targeted by this assay, and inadequate number of  viral copies(<138 copies/mL). A negative result must be combined with clinical observations, patient history, and epidemiological information. The expected result is Negative.  Fact Sheet for Patients:  BloggerCourse.com  Fact Sheet for Healthcare Providers:  SeriousBroker.it  This test is no t yet approved or cleared by the [place] FDA and  has been authorized for detection and/or diagnosis of SARS-CoV-2 by FDA under an Emergency Use Authorization (EUA). This EUA will remain  in effect (meaning this test can be used) for the duration of the COVID-19 declaration under Section 564(b)(1) of the Act, 21 U.S.C.section 360bbb-3(b)(1), unless the authorization is terminated  or revoked sooner.       Influenza A by PCR NEGATIVE NEGATIVE Final   Influenza B by PCR NEGATIVE NEGATIVE Final    Comment: (NOTE) The Xpert Xpress SARS-CoV-2/FLU/RSV plus assay is intended as an aid in the diagnosis of influenza from Nasopharyngeal swab specimens and should not be used as a sole basis for treatment. Nasal washings and aspirates are unacceptable for Xpert Xpress SARS-CoV-2/FLU/RSV testing.  Fact Sheet for Patients: BloggerCourse.com  Fact Sheet for Healthcare Providers: SeriousBroker.it  This test is not yet approved or cleared by the [place] FDA and has been authorized for detection and/or diagnosis of SARS-CoV-2 by FDA under an Emergency Use Authorization (EUA). This EUA will remain in effect (meaning this test can be used) for the duration of the COVID-19 declaration under Section 564(b)(1) of the Act, 21 U.S.C. section 360bbb-3(b)(1), unless the authorization is terminated or revoked.  Performed at [facility], [address] [place], [place] [address]   Culture, blood (x 2)     Status: None   Collection Time: 04/18/21  2:13 PM   Specimen: BLOOD  Result Value  Ref Range Status   Specimen Description BLOOD RIGHT ANTECUBITAL  Final   Special Requests   Final    BOTTLES DRAWN AEROBIC AND ANAEROBIC Blood Culture adequate volume   Culture   Final    NO GROWTH 5 DAYS Performed at [facility], [address], [place], [place] [address]    Report Status 04/23/2021 FINAL  Final  Culture, blood (x 2)     Status: None   Collection Time: 04/18/21  2:55 PM   Specimen: BLOOD  Result Value Ref Range Status   Specimen Description BLOOD BLOOD RIGHT ARM  Final   Special Requests   Final    BOTTLES DRAWN AEROBIC AND ANAEROBIC Blood Culture adequate volume   Culture   Final    NO GROWTH 5 DAYS Performed at [facility], [address], [place], [place] [address]    Report Status 04/23/2021 FINAL  Final      Radiology Studies: No results found.   Scheduled Meds:  amLODipine  5 mg Oral Daily   apixaban  5 mg Oral BID   atorvastatin  10 mg Oral QHS   carvedilol  25 mg Oral BID   gabapentin  300 mg Oral TID   QUEtiapine  300 mg Oral QHS   Continuous Infusions:   LOS: 3 days     Darlin Priestly, MD [facility] If 7PM-7AM, please contact night-coverage 04/27/2021, 4:34 PM

## 2021-04-28 DIAGNOSIS — N179 Acute kidney failure, unspecified: Secondary | ICD-10-CM | POA: Diagnosis not present

## 2021-04-28 LAB — GLUCOSE, CAPILLARY: Glucose-Capillary: 100 mg/dL — ABNORMAL HIGH (ref 70–99)

## 2021-04-28 NOTE — Plan of Care (Signed)
  Problem: Activity: Goal: Risk for activity intolerance will decrease Outcome: Not Progressing   Problem: Coping: Goal: Level of anxiety will decrease Outcome: Not Progressing   Problem: Pain Managment: Goal: General experience of comfort will improve Outcome: Not Progressing   Problem: Safety: Goal: Ability to remain free from injury will improve Outcome: Not Progressing   

## 2021-04-28 NOTE — Progress Notes (Signed)
PROGRESS NOTE    Michael Alvarado  YKD:[id number] DOB: May 27, 1956 DOA: 04/18/2021 PCP: Danella Penton, MD  220A/220A-AA   Assessment & Plan:   Principal Problem:   AKI (acute kidney injury) [facility]) Active Problems:   Dementia (HCC)   Diabetes mellitus without complication (HCC)   HTN (hypertension)   Homeless   Fall at home, initial encounter   Elevated troponin   Hypercalcemia   Chronic back pain   SIRS (systemic inflammatory response syndrome) (HCC)   Depression   Acute pulmonary embolism (HCC)   Pulmonary embolus (HCC)   Michael Alvarado is a 65 y.o. male with medical history significant of homeless, dementia, hypertension, hyperlipidemia, diabetes mellitus, GERD, depression, chronic back pain, who presented to the ED after having a fall while walking for a long time, got tired and dizzy prior to the fall.  He denied LOC or significant injury, and declined CT scans of head and neck in the ED.  Further evaluation found patient to have an acute PE and DVT, AKI, leukoctyosis, mild CK elevation.  Initially treated with heparin, and was transitioned to Eliquis once monitored and remained stable.   Syncope/fall: likely due to pulmonary embolism pt reported after admission that he felt dizzy prior to falling, that it was not a mechanical fall, that he actually passed out.   --PT OT evaluations  --Home health PT recommended, ordered   Acute PE/Right lower extremity DVT: No heart strain evident on CTA chest.   Initially on heparin drip, transitioned to Eliquis.   Has been hemodynamically stable. --Continue Eliquis   Left-sided chest and shoulder pain -reported morning of 11/22.  Troponin normal at 6.   No other ischemic symptoms.  Suspect musculoskeletal etiology   AKI, POA, resolved --Cr 1.95 on presentation.  Baseline around 0.9.  Likely due to dehydration with use of diuretics and Cozaar.   --hold Bumex, HCTZ and Cozaar for now   Dementia: without behavior disturbance.   Baseline  cognition unclear, but on my encounter, appears to have fairly good memory and cognitive processing.   Hypokalemia:  --Monitor and replace as needed   Prediabetes: Recent hemoglobin A1c only 5.8%. Monitor fasting glucose.   Essential hypertension: BP now well controlled.  Presented with very high blood pressure but intermittent low BP --Continue amlodipine and Coreg  --hold Bumex, HCTZ and Cozaar for now   Homelessness: TOC consulted. Per OC, followed by APS for facility placement. "Any and everything concerning this pt's goals of care is dictated by APS. Their social worker w/ Adult Management consultant is working with finding placement and submitting the FL2. For additional information, contact Child psychotherapist w/ DSS Nancy Fetter [phone]" --11/22: Per TOC, a representative from the group home is coming to assess patient  --Patient will need his usual medication refilled as he lost his shoe box containing all of his pill bottles.   Elevated troponin: Very mild elevation and flat.  No chest pain.  Likely secondary to PE and demand ischemia.   SIRS (systemic inflammatory response syndrome) [facility]): Patient meets criteria for SIRS with WBC 14.0, heart rate 95, RR 28, no source of infection identified.  Likely secondary to combination of dehydration and PE. Procalcitonin 0.13.  No antibiotics indicated. No s/sx's of infection.  Monitor   Hypercalcemia: Resolved. Likely from dehydration   Chronic back pain  --Norco 5 mg BID PRN -Continue home Neurontin   Depression  --cont Seroquel   Multiple abrasions/scratch to bilateral knee secondary to fall: Noted.  No  signs of infection.   Ascending thoracic aortic aneurysm - seen on CT chest w contrast.  Recommend semi-annual imaging followup by CTA or MRA and referral to cardiothoracic surgery if not already obtained.   Obesity: Body mass index is 37.36 kg/m.  with > 50% spent at bedside and in coordination of care    DVT prophylaxis:  ZO:[id number] Code Status: Full code  Family Communication:  Level of care: Med-Surg Dispo:   The patient is from: homeless Anticipated d/c is to: group home Anticipated d/c date is: whenever bed available.  Patient currently is ready to d/c.   Subjective and Interval History:  Pt said he didn't remember the last time he felt good, didn't specify complaints.   Objective: Vitals:   04/27/21 2021 04/28/21 0421 04/28/21 0803 04/28/21 1537  BP: (!) 141/89 108/77 120/83 120/77  Pulse: 65 (!) 58 61 66  Resp: 16 16 18 19   Temp: 98.4 F (36.9 C) (!) 97.5 F (36.4 C) 98 F (36.7 C) 99 F (37.2 C)  TempSrc: Oral Oral Oral Oral  SpO2: 100% 98% 99% 99%  Weight:      Height:        Intake/Output Summary (Last 24 hours) at 04/28/2021 1542 Last data filed at 04/28/2021 1014 Gross per 24 hour  Intake 477 ml  Output 450 ml  Net 27 ml   Filed Weights   04/18/21 1216  Weight: 114.8 kg    Examination:   Constitutional: NAD, alert, oriented to person and place, half falling off bed HEENT: conjunctivae and lids normal, EOMI CV: No cyanosis.   RESP: normal respiratory effort, on RA Extremities: No effusions, edema in BLE SKIN: warm, dry Neuro: II - XII grossly intact.     Data Reviewed: I have personally reviewed following labs and imaging studies  CBC: No results for input(s): WBC, NEUTROABS, HGB, HCT, MCV, PLT in the last 168 hours.  Basic Metabolic Panel: Recent Labs  Lab 04/22/21 0706 04/23/21 1039 04/24/21 0441  NA 138 138 136  K 4.8 4.3 4.6  CL 109 106 107  CO2 23 26 24   GLUCOSE 108* 117* 100*  BUN 14 15 19   CREATININE 0.80 0.93 0.94  CALCIUM 10.3 10.6* 10.0   GFR: Estimated Creatinine Clearance: 95.2 mL/min (by C-G formula based on SCr of 0.94 mg/dL). Liver Function Tests: No results for input(s): AST, ALT, ALKPHOS, BILITOT, PROT, ALBUMIN in the last 168 hours.  No results for input(s): LIPASE, AMYLASE in the last 168 hours. No results for input(s):  AMMONIA in the last 168 hours. Coagulation Profile: No results for input(s): INR, PROTIME in the last 168 hours.  Cardiac Enzymes: No results for input(s): CKTOTAL, CKMB, CKMBINDEX, TROPONINI in the last 168 hours. BNP (last 3 results) No results for input(s): PROBNP in the last 8760 hours. HbA1C: No results for input(s): HGBA1C in the last 72 hours. CBG: Recent Labs  Lab 04/24/21 0752 04/25/21 0747 04/26/21 0840 04/27/21 0718 04/28/21 0804  GLUCAP 103* 98 98 100* 100*   Lipid Profile: No results for input(s): CHOL, HDL, LDLCALC, TRIG, CHOLHDL, LDLDIRECT in the last 72 hours. Thyroid Function Tests: No results for input(s): TSH, T4TOTAL, FREET4, T3FREE, THYROIDAB in the last 72 hours. Anemia Panel: No results for input(s): VITAMINB12, FOLATE, FERRITIN, TIBC, IRON, RETICCTPCT in the last 72 hours. Sepsis Labs: No results for input(s): PROCALCITON, LATICACIDVEN in the last 168 hours.   No results found for this or any previous visit (from the past 240 hour(s)).  Radiology Studies: No results found.   Scheduled Meds:  amLODipine  5 mg Oral Daily   apixaban  5 mg Oral BID   atorvastatin  10 mg Oral QHS   carvedilol  25 mg Oral BID   gabapentin  300 mg Oral TID   QUEtiapine  300 mg Oral QHS   Continuous Infusions:   LOS: 4 days     Darlin Priestly, MD [facility] If 7PM-7AM, please contact night-coverage 04/28/2021, 3:42 PM

## 2021-04-29 DIAGNOSIS — N179 Acute kidney failure, unspecified: Secondary | ICD-10-CM | POA: Diagnosis not present

## 2021-04-29 LAB — CBC
HCT: 40.3 % (ref 39.0–52.0)
Hemoglobin: 13.8 g/dL (ref 13.0–17.0)
MCH: 31.6 pg (ref 26.0–34.0)
MCHC: 34.2 g/dL (ref 30.0–36.0)
MCV: 92.2 fL (ref 80.0–100.0)
Platelets: 296 10*3/uL (ref 150–400)
RBC: 4.37 MIL/uL (ref 4.22–5.81)
RDW: 14.6 % (ref 11.5–15.5)
WBC: 6.6 10*3/uL (ref 4.0–10.5)
nRBC: 0 % (ref 0.0–0.2)

## 2021-04-29 LAB — GLUCOSE, CAPILLARY: Glucose-Capillary: 100 mg/dL — ABNORMAL HIGH (ref 70–99)

## 2021-04-29 LAB — BASIC METABOLIC PANEL
Anion gap: 6 (ref 5–15)
BUN: 17 mg/dL (ref 8–23)
CO2: 23 mmol/L (ref 22–32)
Calcium: 9.8 mg/dL (ref 8.9–10.3)
Chloride: 110 mmol/L (ref 98–111)
Creatinine, Ser: 0.89 mg/dL (ref 0.61–1.24)
GFR, Estimated: >60 mL/min (ref >60)
Glucose, Bld: 148 mg/dL — ABNORMAL HIGH (ref 70–99)
Potassium: 3.8 mmol/L (ref 3.5–5.1)
Sodium: 139 mmol/L (ref 135–145)

## 2021-04-29 LAB — MAGNESIUM: Magnesium: 2.5 mg/dL — ABNORMAL HIGH (ref 1.7–2.4)

## 2021-04-29 NOTE — Plan of Care (Signed)
  Problem: Activity: Goal: Risk for activity intolerance will decrease Outcome: Not Progressing   Problem: Coping: Goal: Level of anxiety will decrease Outcome: Not Progressing   Problem: Pain Managment: Goal: General experience of comfort will improve Outcome: Not Progressing   Problem: Safety: Goal: Ability to remain free from injury will improve Outcome: Not Progressing   

## 2021-04-29 NOTE — Progress Notes (Signed)
Physical Therapy Treatment Patient Details Name: Michael Alvarado MRN: [id number] DOB: 06/28/1955 Today's Date: 04/29/2021   History of Present Illness Pt is a 65 y/o M admitted on 04/18/21 with c/c of fall. He refused CT scan of head and neck. Pt also had incidental findings of acute PE & RLE DVT, likely his cause of syncope leading to fall. PMH: dementia, HTN, HLD, DM, GERD, depression, chronic back pain    PT Comments    Pt alert, agreeable to PT. Still exhibiting resistance to safety education and RW management, and time spent at end of session discussing safety in the hospital due to pt voicing he was going to take a shower despite this PT saying no. RN notified of pt's plan. He was able to ambulate <266ft with RW and CGA/supervision, but continues to demonstrate gait, balance, and safety deficits. Limited by ongoing low back pain. Pt up in chair with all needs in reach at end of session. Current plan remains appropriate to continue to attempt pain management and education on safety.    Recommendations for follow up therapy are one component of a multi-disciplinary discharge planning process, led by the attending physician.  Recommendations may be updated based on patient status, additional functional criteria and insurance authorization.  Follow Up Recommendations  Home health PT     Assistance Recommended at Discharge Intermittent Supervision/Assistance  Equipment Recommendations  Rolling walker (2 wheels);BSC/3in1    Recommendations for Other Services       Precautions / Restrictions Precautions Precautions: Fall Precaution Comments: back precautions for comfort Restrictions Weight Bearing Restrictions: No     Mobility  Bed Mobility Overal bed mobility: Modified Independent             General bed mobility comments: Uses bed rails, extra time    Transfers Overall transfer level: Needs assistance Equipment used: Rolling walker (2 wheels);None   Sit to Stand:  Supervision           General transfer comment: cues for safety, pt reaches for sink/furniture    Ambulation/Gait Ambulation/Gait assistance: Min guard;Supervision Gait Distance (Feet): 260 Feet Assistive device: Rolling walker (2 wheels) Gait Pattern/deviations: Decreased step length - left;Decreased dorsiflexion - right;Decreased step length - right;Decreased dorsiflexion - left;Decreased stride length;Shuffle;Trunk flexed       General Gait Details: cues for RW positioning, pt reported unable to due to back pain, occasional proper positioning noted with RW and admbulation. decreased step length/height significantly flexed trunk   Stairs             Wheelchair Mobility    Modified Rankin (Stroke Patients Only)       Balance Overall balance assessment: Needs assistance Sitting-balance support: No upper extremity supported;Feet supported Sitting balance-Leahy Scale: Good     Standing balance support: During functional activity;Bilateral upper extremity supported Standing balance-Leahy Scale: Fair Standing balance comment: Intermittent episodes of poor without AD and with AD far out infront of pt, improving to fair with RW proximity                            Cognition Arousal/Alertness: Awake/alert Behavior During Therapy: WFL for tasks assessed/performed Overall Cognitive Status: No family/caregiver present to determine baseline cognitive functioning                                 General Comments: Pt not receptive to falls prevention, pain management,  and safety education        Exercises      General Comments        Pertinent Vitals/Pain Pain Assessment: Faces Faces Pain Scale: Hurts a little bit Pain Location: reported back pain intermittently, especially with transfers Pain Intervention(s): Monitored during session;Repositioned;Limited activity within patient's tolerance    Home Living                           Prior Function            PT Goals (current goals can now be found in the care plan section) Progress towards PT goals: Progressing toward goals    Frequency    Min 2X/week      PT Plan Current plan remains appropriate    Co-evaluation              AM-PAC PT "6 Clicks" Mobility   Outcome Measure  Help needed turning from your back to your side while in a flat bed without using bedrails?: None Help needed moving from lying on your back to sitting on the side of a flat bed without using bedrails?: A Little Help needed moving to and from a bed to a chair (including a wheelchair)?: A Little Help needed standing up from a chair using your arms (e.g., wheelchair or bedside chair)?: A Little Help needed to walk in hospital room?: A Little Help needed climbing 3-5 steps with a railing? : A Lot 6 Click Score: 18    End of Session Equipment Utilized During Treatment: Gait belt Activity Tolerance: Patient tolerated treatment well Patient left: with call bell/phone within reach;in chair Nurse Communication: Mobility status PT Visit Diagnosis: Unsteadiness on feet (R26.81);Muscle weakness (generalized) (M62.81);Difficulty in walking, not elsewhere classified (R26.2);Pain Pain - part of body:  (back)     Time: 6195-0932 PT Time Calculation (min) (ACUTE ONLY): 25 min  Charges:  $Gait Training: 8-22 mins $Therapeutic Exercise: 8-22 mins                     Olga Coaster PT, DPT 11:50 AM,04/29/21

## 2021-04-29 NOTE — Progress Notes (Signed)
PROGRESS NOTE    Michael Alvarado  YKD:[id number] DOB: May 27, 1956 DOA: 04/18/2021 PCP: Danella Penton, MD  220A/220A-AA   Assessment & Plan:   Principal Problem:   AKI (acute kidney injury) [facility]) Active Problems:   Dementia (HCC)   Diabetes mellitus without complication (HCC)   HTN (hypertension)   Homeless   Fall at home, initial encounter   Elevated troponin   Hypercalcemia   Chronic back pain   SIRS (systemic inflammatory response syndrome) (HCC)   Depression   Acute pulmonary embolism (HCC)   Pulmonary embolus (HCC)   Michael Alvarado is a 65 y.o. male with medical history significant of homeless, dementia, hypertension, hyperlipidemia, diabetes mellitus, GERD, depression, chronic back pain, who presented to the ED after having a fall while walking for a long time, got tired and dizzy prior to the fall.  He denied LOC or significant injury, and declined CT scans of head and neck in the ED.  Further evaluation found patient to have an acute PE and DVT, AKI, leukoctyosis, mild CK elevation.  Initially treated with heparin, and was transitioned to Eliquis once monitored and remained stable.   Syncope/fall: likely due to pulmonary embolism pt reported after admission that he felt dizzy prior to falling, that it was not a mechanical fall, that he actually passed out.   --PT OT evaluations  --Home health PT recommended, ordered   Acute PE/Right lower extremity DVT: No heart strain evident on CTA chest.   Initially on heparin drip, transitioned to Eliquis.   Has been hemodynamically stable. --Continue Eliquis   Left-sided chest and shoulder pain -reported morning of 11/22.  Troponin normal at 6.   No other ischemic symptoms.  Suspect musculoskeletal etiology   AKI, POA, resolved --Cr 1.95 on presentation.  Baseline around 0.9.  Likely due to dehydration with use of diuretics and Cozaar.   --hold Bumex, HCTZ and Cozaar for now   Dementia: without behavior disturbance.   Baseline  cognition unclear, but on my encounter, appears to have fairly good memory and cognitive processing.   Hypokalemia:  --Monitor and replace as needed   Prediabetes: Recent hemoglobin A1c only 5.8%. Monitor fasting glucose.   Essential hypertension: BP now well controlled.  Presented with very high blood pressure but intermittent low BP --Continue amlodipine and Coreg  --hold Bumex, HCTZ and Cozaar for now   Homelessness: TOC consulted. Per OC, followed by [facility] for facility placement. "Any and everything concerning this pt's goals of care is dictated by APS. Their social worker w/ Adult Management consultant is working with finding placement and submitting the FL2. For additional information, contact Child psychotherapist w/ DSS Nancy Fetter [phone]" --11/22: Per TOC, a representative from the group home is coming to assess patient  --Patient will need his usual medication refilled as he lost his shoe box containing all of his pill bottles.   Elevated troponin: Very mild elevation and flat.  No chest pain.  Likely secondary to PE and demand ischemia.   SIRS (systemic inflammatory response syndrome) [facility]): Patient meets criteria for SIRS with WBC 14.0, heart rate 95, RR 28, no source of infection identified.  Likely secondary to combination of dehydration and PE. Procalcitonin 0.13.  No antibiotics indicated. No s/sx's of infection.  Monitor   Hypercalcemia: Resolved. Likely from dehydration   Chronic back pain  --Norco 5 mg BID PRN -Continue home Neurontin   Depression  --cont Seroquel   Multiple abrasions/scratch to bilateral knee secondary to fall: Noted.  No  signs of infection.   Ascending thoracic aortic aneurysm - seen on CT chest w contrast.  Recommend semi-annual imaging followup by CTA or MRA and referral to cardiothoracic surgery if not already obtained.   Obesity: Body mass index is 37.36 kg/m.  with > 50% spent at bedside and in coordination of care    DVT prophylaxis:  OZ:[id number] Code Status: Full code  Family Communication:  Level of care: Med-Surg Dispo:   The patient is from: homeless Anticipated d/c is to: group home Anticipated d/c date is: whenever bed available.  Patient currently is ready to d/c.   Subjective and Interval History:  Up in the chair.  No new complaints.     Objective: Vitals:   04/28/21 1537 04/28/21 1935 04/29/21 0450 04/29/21 0824  BP: 120/77 122/73 126/63 129/69  Pulse: 66 71 64 62  Resp: 19 16 18 18   Temp: 99 F (37.2 C) 98.4 F (36.9 C) 97.8 F (36.6 C) 97.8 F (36.6 C)  TempSrc: Oral Oral Oral   SpO2: 99% 96% 100% 99%  Weight:      Height:        Intake/Output Summary (Last 24 hours) at 04/29/2021 1524 Last data filed at 04/29/2021 1419 Gross per 24 hour  Intake 358 ml  Output 750 ml  Net -392 ml   Filed Weights   04/18/21 1216  Weight: 114.8 kg    Examination:  Constitutional: NAD, alert, oriented to person and place, sitting up in recliner HEENT: conjunctivae and lids normal, EOMI CV: No cyanosis.   RESP: normal respiratory effort, on RA Neuro: II - XII grossly intact.   Psych: Normal mood and affect.     Data Reviewed: I have personally reviewed following labs and imaging studies  CBC: Recent Labs  Lab 04/29/21 0956  WBC 6.6  HGB 13.8  HCT 40.3  MCV 92.2  PLT 296    Basic Metabolic Panel: Recent Labs  Lab 04/23/21 1039 04/24/21 0441 04/29/21 0956  NA 138 136 139  K 4.3 4.6 3.8  CL 106 107 110  CO2 26 24 23   GLUCOSE 117* 100* 148*  BUN 15 19 17   CREATININE 0.93 0.94 0.89  CALCIUM 10.6* 10.0 9.8  MG  --   --  2.5*   GFR: Estimated Creatinine Clearance: 100.6 mL/min (by C-G formula based on SCr of 0.89 mg/dL). Liver Function Tests: No results for input(s): AST, ALT, ALKPHOS, BILITOT, PROT, ALBUMIN in the last 168 hours.  No results for input(s): LIPASE, AMYLASE in the last 168 hours. No results for input(s): AMMONIA in the last 168 hours. Coagulation Profile: No  results for input(s): INR, PROTIME in the last 168 hours.  Cardiac Enzymes: No results for input(s): CKTOTAL, CKMB, CKMBINDEX, TROPONINI in the last 168 hours. BNP (last 3 results) No results for input(s): PROBNP in the last 8760 hours. HbA1C: No results for input(s): HGBA1C in the last 72 hours. CBG: Recent Labs  Lab 04/25/21 0747 04/26/21 0840 04/27/21 0718 04/28/21 0804 04/29/21 0825  GLUCAP 98 98 100* 100* 100*   Lipid Profile: No results for input(s): CHOL, HDL, LDLCALC, TRIG, CHOLHDL, LDLDIRECT in the last 72 hours. Thyroid Function Tests: No results for input(s): TSH, T4TOTAL, FREET4, T3FREE, THYROIDAB in the last 72 hours. Anemia Panel: No results for input(s): VITAMINB12, FOLATE, FERRITIN, TIBC, IRON, RETICCTPCT in the last 72 hours. Sepsis Labs: No results for input(s): PROCALCITON, LATICACIDVEN in the last 168 hours.   No results found for this or any previous visit (from  the past 240 hour(s)).     Radiology Studies: No results found.   Scheduled Meds:  amLODipine  5 mg Oral Daily   apixaban  5 mg Oral BID   atorvastatin  10 mg Oral QHS   carvedilol  25 mg Oral BID   gabapentin  300 mg Oral TID   QUEtiapine  300 mg Oral QHS   Continuous Infusions:   LOS: 5 days     Darlin Priestly, MD [facility] If 7PM-7AM, please contact night-coverage 04/29/2021, 3:24 PM

## 2021-04-29 NOTE — TOC Progression Note (Signed)
Transition of Care [facility]) - Progression Note    Patient Details  Name: Michael Alvarado MRN: [id number] Date of Birth: [birth year]  [facility]) CM/SW Contact  Chapman Fitch, RN Phone Number: 04/29/2021, 2:31 PM  Clinical Narrative:     Spoke with Joy at [facility].  She states that Doristine Mango came to assessment patient today and feels like the patient will be a good fit for the facility.  He is to notify Joy tomorrow which location patient will be going to. Will follow up with [facility] tomorrow   Expected Discharge Plan: Skilled Nursing Facility Barriers to Discharge: Continued Medical Work up  Expected Discharge Plan and Services Expected Discharge Plan: Skilled Nursing Facility In-house Referral: Clinical Social Work   Post Acute Care Choice: Skilled Nursing Facility Living arrangements for the past 2 months: Single Family Home                                       Social Determinants of Health (SDOH) Interventions    Readmission Risk Interventions No flowsheet data found.

## 2021-04-30 LAB — GLUCOSE, CAPILLARY: Glucose-Capillary: 85 mg/dL (ref 70–99)

## 2021-04-30 NOTE — Progress Notes (Signed)
PROGRESS NOTE    Michael Alvarado  YKD:[id number] DOB: May 27, 1956 DOA: 04/18/2021 PCP: Danella Penton, MD  220A/220A-AA   Assessment & Plan:   Principal Problem:   AKI (acute kidney injury) [facility]) Active Problems:   Dementia (HCC)   Diabetes mellitus without complication (HCC)   HTN (hypertension)   Homeless   Fall at home, initial encounter   Elevated troponin   Hypercalcemia   Chronic back pain   SIRS (systemic inflammatory response syndrome) (HCC)   Depression   Acute pulmonary embolism (HCC)   Pulmonary embolus (HCC)   Michael Alvarado is a 65 y.o. male with medical history significant of homeless, dementia, hypertension, hyperlipidemia, diabetes mellitus, GERD, depression, chronic back pain, who presented to the ED after having a fall while walking for a long time, got tired and dizzy prior to the fall.  He denied LOC or significant injury, and declined CT scans of head and neck in the ED.  Further evaluation found patient to have an acute PE and DVT, AKI, leukoctyosis, mild CK elevation.  Initially treated with heparin, and was transitioned to Eliquis once monitored and remained stable.   Syncope/fall: likely due to pulmonary embolism pt reported after admission that he felt dizzy prior to falling, that it was not a mechanical fall, that he actually passed out.   --PT OT evaluations  --Home health PT recommended, ordered   Acute PE/Right lower extremity DVT: No heart strain evident on CTA chest.   Initially on heparin drip, transitioned to Eliquis.   Has been hemodynamically stable. --Continue Eliquis   Left-sided chest and shoulder pain -reported morning of 11/22.  Troponin normal at 6.   No other ischemic symptoms.  Suspect musculoskeletal etiology   AKI, POA, resolved --Cr 1.95 on presentation.  Baseline around 0.9.  Likely due to dehydration with use of diuretics and Cozaar.   --hold Bumex, HCTZ and Cozaar for now   Dementia: without behavior disturbance.   Baseline  cognition unclear, but on my encounter, appears to have fairly good memory and cognitive processing.   Hypokalemia:  --Monitor and replace as needed   Prediabetes: Recent hemoglobin A1c only 5.8%. Monitor fasting glucose.   Essential hypertension: BP now well controlled.  Presented with very high blood pressure but intermittent low BP --Continue amlodipine and Coreg  --hold Bumex, HCTZ and Cozaar for now   Homelessness: TOC consulted. Per OC, followed by [facility] for facility placement. "Any and everything concerning this pt's goals of care is dictated by [facility]. Their social worker w/ Adult Management consultant is working with finding placement and submitting the FL2. For additional information, contact Child psychotherapist w/ DSS Nancy Fetter [phone]" --11/22: Per TOC, a representative from the group home is coming to assess patient  --Patient will need his usual medication refilled as he lost his shoe box containing all of his pill bottles.   Elevated troponin: Very mild elevation and flat.  No chest pain.  Likely secondary to PE and demand ischemia.   SIRS (systemic inflammatory response syndrome) [facility]): Patient meets criteria for SIRS with WBC 14.0, heart rate 95, RR 28, no source of infection identified.  Likely secondary to combination of dehydration and PE. Procalcitonin 0.13.  No antibiotics indicated. No s/sx's of infection.  Monitor   Hypercalcemia: Resolved. Likely from dehydration   Chronic back pain  --Norco 5 mg BID PRN -Continue home Neurontin   Depression  --cont Seroquel   Multiple abrasions/scratch to bilateral knee secondary to fall: Noted.  No  signs of infection.   Ascending thoracic aortic aneurysm - seen on CT chest w contrast.  Recommend semi-annual imaging followup by CTA or MRA and referral to cardiothoracic surgery if not already obtained.   Obesity: Body mass index is 37.36 kg/m.  with > 50% spent at bedside and in coordination of care    DVT prophylaxis:  WE:[id number] Code Status: Full code  Family Communication:  Level of care: Med-Surg Dispo:   The patient is from: homeless Anticipated d/c is to: group home Anticipated d/c date is: whenever bed available.  Patient currently is ready to d/c.   Subjective and Interval History:  Pt was walking on his own to and from bathroom.   Objective: Vitals:   04/29/21 2020 04/30/21 0400 04/30/21 0817 04/30/21 1602  BP: (!) 145/83 126/65 127/71 118/72  Pulse: 68 61 (!) 58 70  Resp: 18 18 20    Temp: 99.7 F (37.6 C) 99.1 F (37.3 C) 98.1 F (36.7 C) 98.9 F (37.2 C)  TempSrc: Oral Oral Oral Oral  SpO2: 98% 98% 99% 95%  Weight:      Height:        Intake/Output Summary (Last 24 hours) at 04/30/2021 1954 Last data filed at 04/30/2021 1850 Gross per 24 hour  Intake 480 ml  Output 450 ml  Net 30 ml   Filed Weights   04/18/21 1216  Weight: 114.8 kg    Examination:   Constitutional: NAD, AAOx3, walking on his own HEENT: conjunctivae and lids normal, EOMI CV: No cyanosis.   RESP: normal respiratory effort, on RA Neuro: II - XII grossly intact.     Data Reviewed: I have personally reviewed following labs and imaging studies  CBC: Recent Labs  Lab 04/29/21 0956  WBC 6.6  HGB 13.8  HCT 40.3  MCV 92.2  PLT 296    Basic Metabolic Panel: Recent Labs  Lab 04/24/21 0441 04/29/21 0956  NA 136 139  K 4.6 3.8  CL 107 110  CO2 24 23  GLUCOSE 100* 148*  BUN 19 17  CREATININE 0.94 0.89  CALCIUM 10.0 9.8  MG  --  2.5*   GFR: Estimated Creatinine Clearance: 100.6 mL/min (by C-G formula based on SCr of 0.89 mg/dL). Liver Function Tests: No results for input(s): AST, ALT, ALKPHOS, BILITOT, PROT, ALBUMIN in the last 168 hours.  No results for input(s): LIPASE, AMYLASE in the last 168 hours. No results for input(s): AMMONIA in the last 168 hours. Coagulation Profile: No results for input(s): INR, PROTIME in the last 168 hours.  Cardiac Enzymes: No results for  input(s): CKTOTAL, CKMB, CKMBINDEX, TROPONINI in the last 168 hours. BNP (last 3 results) No results for input(s): PROBNP in the last 8760 hours. HbA1C: No results for input(s): HGBA1C in the last 72 hours. CBG: Recent Labs  Lab 04/26/21 0840 04/27/21 0718 04/28/21 0804 04/29/21 0825 04/30/21 0813  GLUCAP 98 100* 100* 100* 85   Lipid Profile: No results for input(s): CHOL, HDL, LDLCALC, TRIG, CHOLHDL, LDLDIRECT in the last 72 hours. Thyroid Function Tests: No results for input(s): TSH, T4TOTAL, FREET4, T3FREE, THYROIDAB in the last 72 hours. Anemia Panel: No results for input(s): VITAMINB12, FOLATE, FERRITIN, TIBC, IRON, RETICCTPCT in the last 72 hours. Sepsis Labs: No results for input(s): PROCALCITON, LATICACIDVEN in the last 168 hours.   No results found for this or any previous visit (from the past 240 hour(s)).     Radiology Studies: No results found.   Scheduled Meds:  amLODipine  5 mg  Oral Daily   apixaban  5 mg Oral BID   atorvastatin  10 mg Oral QHS   carvedilol  25 mg Oral BID   gabapentin  300 mg Oral TID   QUEtiapine  300 mg Oral QHS   Continuous Infusions:   LOS: 6 days     Darlin Priestly, MD [facility] If 7PM-7AM, please contact night-coverage 04/30/2021, 7:54 PM

## 2021-04-30 NOTE — Plan of Care (Signed)

## 2021-04-30 NOTE — Progress Notes (Signed)
Occupational Therapy Treatment Patient Details Name: Michael Alvarado MRN: [id number] DOB: 21-Jul-1955 Today's Date: 04/30/2021   History of present illness Pt is a 65 y/o M admitted on 04/18/21 with c/c of fall. He refused CT scan of head and neck. Pt also had incidental findings of acute PE & RLE DVT, likely his cause of syncope leading to fall. PMH: dementia, HTN, HLD, DM, GERD, depression, chronic back pain   OT comments  Chart reviewed, RN cleared pt for participation in OT tx session. Pt greeted in bed, agreeable to session however vcs required throughout for safety during functional ADL, use of AD. Tx session targeted improving safety during functional mobility/ADL completion, therex for increased strength for increased independence/safety during ADL completion. Pt completes all therex with good recall from previous tx session despite saying "I don't remember anything". As stated above, vcs required throughout for safe use of AD (vs furniture walking) during ambulation to and standing ADL tasks at sink level. Progress is noted as compared to previous tx sessions, however. Pt is left in bedside chair, NAD, all needs met. RN aware of pt status. OT will continue to follow.    Recommendations for follow up therapy are one component of a multi-disciplinary discharge planning process, led by the attending physician.  Recommendations may be updated based on patient status, additional functional criteria and insurance authorization.    Follow Up Recommendations  Home health OT    Assistance Recommended at Discharge Intermittent Supervision/Assistance  Equipment Recommendations  BSC/3in1    Recommendations for Other Services      Precautions / Restrictions Precautions Precautions: Fall Precaution Comments: back precautions for comfort       Mobility Bed Mobility Overal bed mobility: Modified Independent Bed Mobility: Supine to Sit     Supine to sit: Supervision           Transfers Overall transfer level: Needs assistance Equipment used: Rolling walker (2 wheels);None Transfers: Sit to/from Stand;Bed to chair/wheelchair/BSC Sit to Stand: Supervision Stand pivot transfers: Min guard         General transfer comment: vcs throughout for safety     Balance Overall balance assessment: Needs assistance Sitting-balance support: No upper extremity supported;Feet supported Sitting balance-Leahy Scale: Good     Standing balance support: During functional activity;Bilateral upper extremity supported Standing balance-Leahy Scale: Fair                             ADL either performed or assessed with clinical judgement   ADL Overall ADL's : Needs assistance/impaired                                     Functional mobility during ADLs: Min guard General ADL Comments: CGA for funcitonal ADLs with RW, agreed to use RW with education re: pain management. Pt not recpeitve to education re: safety. Pt required CGA-CLOSE SUP for grooming, UB dressing standing at sink level.    Extremity/Trunk Assessment              Vision       Perception     Praxis      Cognition Arousal/Alertness: Awake/alert Behavior During Therapy: WFL for tasks assessed/performed Overall Cognitive Status: No family/caregiver present to determine baseline cognitive functioning  Exercises General Exercises - Upper Extremity Shoulder Flexion: Strengthening;10 reps;Right;Left Shoulder ABduction: Strengthening;Right;Left;10 reps Shoulder ADduction: Strengthening;Left;Right;10 reps Shoulder Horizontal ABduction: Strengthening;Right;Left;10 reps Shoulder Horizontal ADduction: Strengthening;Right;Left;10 reps Elbow Flexion: Strengthening;Right;Left;10 reps Elbow Extension: Strengthening;10 reps;Left;Right   Shoulder Instructions       General Comments      Pertinent Vitals/ Pain        Pain Assessment: 0-10 Pain Score: 10-Worst pain ever Pain Intervention(s): Limited activity within patient's tolerance;Monitored during session;Relaxation  Home Living                                          Prior Functioning/Environment              Frequency  Min 2X/week        Progress Toward Goals  OT Goals(current goals can now be found in the care plan section)  Progress towards OT goals: Progressing toward goals  Acute Rehab OT Goals Patient Stated Goal: decreased pain OT Goal Formulation: With patient Potential to Achieve Goals: Good  Plan Discharge plan remains appropriate;Frequency remains appropriate    Co-evaluation                 AM-PAC OT "6 Clicks" Daily Activity     Outcome Measure   Help from another person eating meals?: None Help from another person taking care of personal grooming?: A Little Help from another person toileting, which includes using toliet, bedpan, or urinal?: A Little Help from another person bathing (including washing, rinsing, drying)?: A Little Help from another person to put on and taking off regular upper body clothing?: None Help from another person to put on and taking off regular lower body clothing?: A Little 6 Click Score: 20    End of Session Equipment Utilized During Treatment: Rolling walker (2 wheels)  OT Visit Diagnosis: Unsteadiness on feet (R26.81);Repeated falls (R29.6);Muscle weakness (generalized) (M62.81)   Activity Tolerance Patient tolerated treatment well   Patient Left in chair;with call bell/phone within reach;with chair alarm set   Nurse Communication Mobility status        Time: 1464-3142 OT Time Calculation (min): 20 min  Charges: OT General Charges $OT Visit: 1 Visit OT Treatments $Self Care/Home Management : 8-22 mins  Shanon Payor, OTD OTR/L  04/30/21, 10:30 AM

## 2021-04-30 NOTE — TOC Progression Note (Signed)
Transition of Care [facility]) - Progression Note    Patient Details  Name: Michael Alvarado MRN: [id number] Date of Birth: Nov 02, 1955  [facility] CM/SW Contact  Chapman Fitch, RN Phone Number: 04/30/2021, 2:46 PM  Clinical Narrative:    VM left for Joy at [facility]   Expected Discharge Plan: Skilled Nursing Facility Barriers to Discharge: Continued Medical Work up  Expected Discharge Plan and Services Expected Discharge Plan: Skilled Nursing Facility In-house Referral: Clinical Social Work   Post Acute Care Choice: Skilled Nursing Facility Living arrangements for the past 2 months: Single Family Home                                       Social Determinants of Health (SDOH) Interventions    Readmission Risk Interventions No flowsheet data found.

## 2021-05-01 MED ORDER — METHOCARBAMOL 750 MG PO TABS: 750.0000 mg | ORAL_TABLET | Freq: Four times a day (QID) | ORAL | Status: AC | PRN

## 2021-05-01 MED ORDER — TRAMADOL HCL 50 MG PO TABS: 100.0000 mg | ORAL_TABLET | Freq: Four times a day (QID) | ORAL | 0 refills | Status: AC | PRN

## 2021-05-01 MED ORDER — APIXABAN 5 MG PO TABS: 5.0000 mg | ORAL_TABLET | Freq: Two times a day (BID) | ORAL | Status: AC

## 2021-05-01 NOTE — [place] FL2 (Signed)
[place] MEDICAID FL2 LEVEL OF CARE SCREENING TOOL     IDENTIFICATION  Patient Name: Michael Alvarado Birthdate: 12/12/1955 Sex: male Admission Date (Current Location): 04/18/2021  [facility] and [place] Number:  [name] [id number] [facility] Facility and Address:  [facility], [address] [place], [place] [address]      Provider Number: [id number]  Attending Physician Name and Address:  Darlin Priestly, MD  Relative Name and Phone Number:  [name] (Sister)   [phone] [facility])    Current Level of Care: Hospital Recommended Level of Care: Assisted Living Facility, [facility], Other (Comment) (Group Home) Prior Approval Number:    Date Approved/Denied:   PASRR Number:    Discharge Plan:      Current Diagnoses: Patient Active Problem List   Diagnosis Date Noted   Pulmonary embolus (HCC) 04/24/2021   AKI (acute kidney injury) (HCC) 04/18/2021   HTN (hypertension) 04/18/2021   Homeless 04/18/2021   Fall at home, initial encounter 04/18/2021   Elevated troponin 04/18/2021   Leukocytosis 04/18/2021   Hypercalcemia 04/18/2021   Chronic back pain 04/18/2021   SIRS (systemic inflammatory response syndrome) (HCC) 04/18/2021   Depression 04/18/2021   Acute pulmonary embolism (HCC) 04/18/2021   AMS (altered mental status) 04/29/2020   Dementia (HCC)    Diabetes mellitus without complication (HCC)    Hypertensive urgency     Orientation RESPIRATION BLADDER Height & Weight     Self, Place, Time  Normal Continent Weight: 114.8 kg Height:  5\' 9"  (175.3 cm)  BEHAVIORAL SYMPTOMS/MOOD NEUROLOGICAL BOWEL NUTRITION STATUS  Wanderer   Continent Diet  AMBULATORY STATUS COMMUNICATION OF NEEDS Skin   Supervision Verbally Normal                       Personal Care Assistance Level of Assistance  Bathing, Feeding, Dressing, Total care Bathing Assistance: Independent Feeding assistance: Independent Dressing Assistance: Independent Total Care  Assistance: Limited assistance   Functional Limitations Info  Sight, Hearing, Speech Sight Info: Adequate Hearing Info: Adequate Speech Info: Adequate    SPECIAL CARE FACTORS FREQUENCY                       Contractures Contractures Info: Not present    Additional Factors Info                  STOP taking these medications     bumetanide 1 MG tablet Commonly known as: BUMEX    losartan 100 MG tablet Commonly known as: COZAAR    oxyCODONE-acetaminophen 5-325 MG tablet Commonly known as: Percocet    traMADol 50 MG tablet Commonly known as: ULTRAM           TAKE these medications     amLODipine 5 MG tablet Commonly known as: NORVASC Take 5 mg by mouth daily.    apixaban 5 MG Tabs tablet Commonly known as: ELIQUIS Take 1 tablet (5 mg total) by mouth 2 (two) times daily.    atorvastatin 10 MG tablet Commonly known as: LIPITOR Take 10 mg by mouth daily.    butalbital-acetaminophen-caffeine 50-325-40 MG tablet Commonly known as: FIORICET Take 1 tablet by mouth 2 (two) times daily as needed for headache.    carvedilol 25 MG tablet Commonly known as: COREG Take 25 mg by mouth 2 (two) times daily.    gabapentin 300 MG capsule Commonly known as: NEURONTIN Take 300-600 mg by mouth in the morning, at noon, and at bedtime.  hydrochlorothiazide 25 MG tablet Commonly known as: HYDRODIURIL Take 25 mg by mouth daily.    methocarbamol 750 MG tablet Commonly known as: ROBAXIN Take 1 tablet (750 mg total) by mouth every 6 (six) hours as needed for muscle spasms.    QUEtiapine 300 MG tablet Commonly known as: SEROQUEL Take 300 mg by mouth at bedtime.               Follow-up Information       Danella Penton, MD Follow up in 1 week(s).   Specialty: Internal Medicine Contact information: [phone] [facility] [address] [facility] [place] [facility] [place] [address] [phone]     Relevant Imaging Results:  Relevant Lab  Results:   Additional Information SS# [id number]  Chapman Fitch, RN

## 2021-05-01 NOTE — TOC Transition Note (Addendum)
Transition of Care [facility]) - CM/SW Discharge Note   Patient Details  Name: Michael Alvarado MRN: [id number] Date of Birth: 1956/01/01  [facility] of [facility]) CM/SW Contact:  Gildardo Griffes, LCSW Phone Number: 05/01/2021, 2:35 PM   Clinical Narrative:     Patient to discharge to [facility] at [address] in [place]. [facility] transport has been called they will arrive in 30 minutes, RN and MD aware. Updated FL2 faxed to family care home and to APS. All parties aware of dc today. Patient not compliant with receiving any DME at time of discharge.   CSW has sent referrals for home health agencies, [facility] accepted.   No other discharge needs noted at this time.     Final next level of care: Group Home Barriers to Discharge: No Barriers Identified   Patient Goals and CMS Choice Patient states their goals for this hospitalization and ongoing recovery are:: to go home CMS Medicare.gov Compare Post Acute Care list provided to:: Patient Choice offered to / list presented to : Patient  Discharge Placement                    Patient and family notified of of transfer: 05/01/21  Discharge Plan and Services In-house Referral: Clinical Social Work   Post Acute Care Choice: Skilled Nursing Facility                    HH Arranged: PT, OT          Social Determinants of Health (SDOH) Interventions     Readmission Risk Interventions No flowsheet data found.

## 2021-05-01 NOTE — Plan of Care (Signed)

## 2021-05-01 NOTE — Discharge Summary (Addendum)
Physician Discharge Summary   Michael Alvarado  male DOB: 1955/10/22  WFU:[id number]  PCP: Danella Penton, MD  Admit date: 04/18/2021 Discharge date: 05/01/2021  Admitted From: homeless Disposition:  group home Home Health: Yes CODE STATUS: Full code   Hospital Course:  For full details, please see H&P, progress notes, consult notes and ancillary notes.  Briefly,  Michael Alvarado is a 65 y.o. male with medical history significant of homeless, dementia, hypertension, diabetes mellitus, depression, chronic back pain, who presented to the ED after having a fall while walking for a long time, got tired and dizzy prior to the fall.  He denied LOC or significant injury, and declined CT scans of head and neck in the ED.  Further evaluation found patient to have an acute PE and DVT, AKI, leukoctyosis, mild CK elevation.  Initially treated with heparin, and was transitioned to Eliquis once monitored and remained stable.   Syncope/fall: likely due to pulmonary embolism pt reported after admission that he felt dizzy prior to falling, that it was not a mechanical fall, that he actually passed out.   --no further syncopal event since presentation.   Acute PE/Right lower extremity DVT:  No heart strain evident on CTA chest.   Initially on heparin drip, transitioned to Eliquis.   Has been hemodynamically stable. --Continue Eliquis for 3-6 months.   Left-sided chest and shoulder pain, non-cardiac -reported morning of 11/22.  Troponin normal at 6.   No other ischemic symptoms.  Suspect musculoskeletal etiology   AKI, POA, resolved --Cr 1.95 on presentation.  Baseline around 0.9.  Likely due to dehydration with use of diuretics.  Med rec showing not taking Losartan PTA. --Bumex, HCTZ held during hospitalization.  HCTZ resumed after discharge.   Dementia: without behavior disturbance.   Baseline cognition unclear, but appears to have fairly good memory and cognitive processing.   Hypokalemia:   --Monitored and replaced as needed   Prediabetes:  Recent hemoglobin A1c only 5.8%. No need for hypoglycemics.     Essential hypertension:  Presented with very high blood pressure but intermittent low BP.  BP now well controlled.   --Continue amlodipine and Coreg  --Med rec showing not taking Losartan PTA. --Bumex, HCTZ held during hospitalization.  HCTZ resumed after discharge.   Homelessness:  --TOC and APS worked on getting pt to a group home.   SIRS (systemic inflammatory response syndrome) [facility]): Patient meets criteria for SIRS with WBC 14.0, heart rate 95, RR 28, no source of infection identified.  Likely secondary to combination of dehydration and PE. Procalcitonin 0.13.  No antibiotics indicated. No s/sx's of infection.     Hypercalcemia: Resolved.  Likely from dehydration   Chronic back pain  -Continue home Neurontin and Robaxin. -cont tramadol PRN for pain (provided Rx for a short course until pt can follow up with his outpatient prescriber).   Depression  --cont Seroquel   Multiple abrasions/scratch to bilateral knee secondary to fall:  No signs of infection.   Ascending thoracic aortic aneurysm  - seen on CT chest w contrast.  Recommend semi-annual imaging followup by CTA or MRA and referral to cardiothoracic surgery if not already obtained.   Obesity: Body mass index is 37.36 kg/m.    Discharge Diagnoses:  Principal Problem:   AKI (acute kidney injury) (HCC) Active Problems:   Dementia (HCC)   Diabetes mellitus without complication (HCC)   HTN (hypertension)   Homeless   Fall at home, initial encounter   Elevated troponin  Hypercalcemia   Chronic back pain   SIRS (systemic inflammatory response syndrome) (HCC)   Depression   Acute pulmonary embolism (HCC)   Pulmonary embolus (HCC)   30 Day Unplanned Readmission Risk Score    Flowsheet Row ED to Hosp-Admission (Current) from 04/18/2021 in [facility]  30  Day Unplanned Readmission Risk Score (%) 14.8 Filed at 05/01/2021 1200       This score is the patient's risk of an unplanned readmission within 30 days of being discharged (0 -100%). The score is based on dignosis, age, lab data, medications, orders, and past utilization.   Low:  0-14.9   Medium: 15-21.9   High: 22-29.9   Extreme: 30 and above         Discharge Instructions:  Allergies as of 05/01/2021   No Known Allergies      Medication List     STOP taking these medications    bumetanide 1 MG tablet Commonly known as: BUMEX   losartan 100 MG tablet Commonly known as: COZAAR   oxyCODONE-acetaminophen 5-325 MG tablet Commonly known as: Percocet       TAKE these medications    amLODipine 5 MG tablet Commonly known as: NORVASC Take 5 mg by mouth daily.   apixaban 5 MG Tabs tablet Commonly known as: ELIQUIS Take 1 tablet (5 mg total) by mouth 2 (two) times daily.   atorvastatin 10 MG tablet Commonly known as: LIPITOR Take 10 mg by mouth daily.   butalbital-acetaminophen-caffeine 50-325-40 MG tablet Commonly known as: FIORICET Take 1 tablet by mouth 2 (two) times daily as needed for headache.   carvedilol 25 MG tablet Commonly known as: COREG Take 25 mg by mouth 2 (two) times daily.   gabapentin 300 MG capsule Commonly known as: NEURONTIN Take 300-600 mg by mouth in the morning, at noon, and at bedtime.   hydrochlorothiazide 25 MG tablet Commonly known as: HYDRODIURIL Take 25 mg by mouth daily.   methocarbamol 750 MG tablet Commonly known as: ROBAXIN Take 1 tablet (750 mg total) by mouth every 6 (six) hours as needed for muscle spasms.   QUEtiapine 300 MG tablet Commonly known as: SEROQUEL Take 300 mg by mouth at bedtime.   traMADol 50 MG tablet Commonly known as: ULTRAM Take 2 tablets (100 mg total) by mouth every 6 (six) hours as needed for up to 5 days for moderate pain. What changed: how much to take         Follow-up Information      Danella Penton, MD. Go on 05/08/2021.   Specialty: Internal Medicine Why: 11am appointment Contact information: [address] [facility] [place] [facility] [place] [address] [phone]                 No Known Allergies   The results of significant diagnostics from this hospitalization (including imaging, microbiology, ancillary and laboratory) are listed below for reference.   Consultations:   Procedures/Studies: CT Chest W Contrast  Result Date: 04/18/2021 CLINICAL DATA:  Recent syncopal episode with abnormal chest x-ray, initial encounter EXAM: CT CHEST WITH CONTRAST TECHNIQUE: Multidetector CT imaging of the chest was performed during intravenous contrast administration. CONTRAST:  75mL OMNIPAQUE IOHEXOL 300 MG/ML  SOLN COMPARISON:  Chest x-ray from earlier in the same day. FINDINGS: Cardiovascular: Thoracic aorta and its branches demonstrate atherosclerotic calcification. Mild dilatation of the ascending aorta to 4.5 cm is noted. Normal tapering is seen distally in the descending thoracic aorta. Diffuse coronary calcifications  are seen. No cardiac enlargement is noted. The pulmonary artery is not timed for embolus evaluation although multiple filling defects are identified most prominent in the left lower lobe but to a lesser degree in the right lower lobe consistent with pulmonary emboli. No evidence of right heart strain is seen. Mediastinum/Nodes: Thoracic inlet is within normal limits. No sizable hilar or mediastinal adenopathy is noted. Mild mediastinal fatty prominence is noted. The esophagus as visualized is within normal limits. Lungs/Pleura: Lungs are well aerated bilaterally. Minimal atelectatic changes are noted in the bases. No sizable infiltrate or parenchymal nodule is noted. The area of abnormality on prior chest x-ray is related to the prominent mediastinal fat and tortuous vascularity. Patient rotation to the right also accentuated these  changes on the prior plain film. 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 Fatty infiltration of the liver is seen. A small renal cyst is noted on the right. Musculoskeletal: Degenerative changes of the thoracic spine are noted. No acute rib abnormality is seen. No compression deformities are noted. IMPRESSION: Note is made of incidental pulmonary embolism particularly in the left lower lobe. No evidence of right heart strain is noted. Dilatation of the ascending aorta to 4.5 cm with normal tapering distally. Ascending thoracic aortic aneurysm. Recommend semi-annual imaging followup by CTA or MRA and referral to cardiothoracic surgery if not already obtained. This recommendation follows 2010 ACCF/AHA/AATS/ACR/ASA/SCA/SCAI/SIR/STS/SVM Guidelines for the Diagnosis and Management of Patients With Thoracic Aortic Disease. Circulation. 2010; 121: J883-G549. Aortic aneurysm NOS (ICD10-I71.9) Right apical abnormality on prior chest x-ray is related to tortuous vascularity and mild mediastinal lipomatosis as well as patient rotation on the prior exam. Aortic Atherosclerosis (ICD10-I70.0). Critical Value/emergent results were called by telephone at the time of interpretation on 04/18/2021 at 5:16 pm to Dr. Clyde Lundborg, who verbally acknowledged these results. Electronically Signed   By: Alcide Clever M.D.   On: 04/18/2021 17:19   US Venous Img Lower Bilateral (DVT)  Result Date: 04/18/2021 CLINICAL DATA:  Acute pulmonary embolism. EXAM: BILATERAL LOWER EXTREMITY VENOUS DOPPLER ULTRASOUND TECHNIQUE: Gray-scale sonography with graded compression, as well as color Doppler and duplex ultrasound were performed to evaluate the lower extremity deep venous systems from the level of the common femoral vein and including the common femoral, femoral, profunda femoral, popliteal and calf veins including the posterior tibial, peroneal and gastrocnemius veins when  visible. The superficial great saphenous vein was also interrogated. Spectral Doppler was utilized to evaluate flow at rest and with distal augmentation maneuvers in the common femoral, femoral and popliteal veins. COMPARISON:  None. FINDINGS: RIGHT LOWER EXTREMITY Common Femoral Vein: No evidence of thrombus. Normal compressibility, respiratory phasicity and response to augmentation. Saphenofemoral Junction: No evidence of thrombus. Normal compressibility and flow on color Doppler imaging. Profunda Femoral Vein: No evidence of thrombus. Normal compressibility and flow on color Doppler imaging. Femoral Vein: Evidence of nonocclusive thrombus with abnormal compressibility, respiratory phasicity and response to augmentation. Popliteal Vein: Evidence of nonocclusive thrombus with abnormal compressibility, respiratory phasicity and response to augmentation. Calf Veins: The RIGHT posterior tibial vein is not visualized. No evidence of thrombus within the RIGHT peroneal vein with normal compressibility and normal flow on color Doppler imaging. Superficial Great Saphenous Vein: No evidence of thrombus. Normal compressibility. Venous Reflux:  None. Other Findings:  None. LEFT LOWER EXTREMITY Common Femoral Vein: No evidence of thrombus. Normal compressibility, respiratory phasicity and response to augmentation. Saphenofemoral Junction: No evidence of thrombus. Normal compressibility and  flow on color Doppler imaging. Profunda Femoral Vein: No evidence of thrombus. Normal compressibility and flow on color Doppler imaging. Femoral Vein: No evidence of thrombus. Normal compressibility, respiratory phasicity and response to augmentation. Popliteal Vein: No evidence of thrombus. Normal compressibility, respiratory phasicity and response to augmentation. Calf Veins: No evidence of thrombus. Normal compressibility and flow on color Doppler imaging. Superficial Great Saphenous Vein: No evidence of thrombus. Normal compressibility.  Venous Reflux:  None. Other Findings:  None. IMPRESSION: 1. Nonocclusive DVT within the RIGHT femoral and RIGHT popliteal veins. 2. No evidence of DVT within the LEFT lower extremity. Electronically Signed   By: Aram Candela M.D.   On: 04/18/2021 21:37   DG Chest Portable 1 View  Result Date: 04/18/2021 CLINICAL DATA:  Cough, weakness. EXAM: PORTABLE CHEST 1 VIEW COMPARISON:  April 29, 2020. FINDINGS: Stable cardiomediastinal silhouette. Left lung is clear. Right apical density is now noted, although it may be due to the patient being rotated. Possible mass cannot be excluded. 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 Bony thorax is unremarkable. IMPRESSION: Right apical density is now noted. CT scan of the chest with intravenous contrast is recommended to rule out nodule or mass. Electronically Signed   By: Lupita Raider M.D.   On: 04/18/2021 11:15      Labs: BNP (last 3 results) No results for input(s): BNP in the last 8760 hours. Basic Metabolic Panel: Recent Labs  Lab 04/29/21 0956  NA 139  K 3.8  CL 110  CO2 23  GLUCOSE 148*  BUN 17  CREATININE 0.89  CALCIUM 9.8  MG 2.5*   Liver Function Tests: No results for input(s): AST, ALT, ALKPHOS, BILITOT, PROT, ALBUMIN in the last 168 hours. No results for input(s): LIPASE, AMYLASE in the last 168 hours. No results for input(s): AMMONIA in the last 168 hours. CBC: Recent Labs  Lab 04/29/21 0956  WBC 6.6  HGB 13.8  HCT 40.3  MCV 92.2  PLT 296   Cardiac Enzymes: No results for input(s): CKTOTAL, CKMB, CKMBINDEX, TROPONINI in the last 168 hours. BNP: Invalid input(s): POCBNP CBG: Recent Labs  Lab 04/26/21 0840 04/27/21 0718 04/28/21 0804 04/29/21 0825 04/30/21 0813  GLUCAP 98 100* 100* 100* 85   D-Dimer No results for input(s): DDIMER in the last 72 hours. Hgb A1c No results for input(s): HGBA1C in the last 72 hours. Lipid Profile No results for input(s): CHOL, HDL, LDLCALC, TRIG, CHOLHDL,  LDLDIRECT in the last 72 hours. Thyroid function studies No results for input(s): TSH, T4TOTAL, T3FREE, THYROIDAB in the last 72 hours.  Invalid input(s): FREET3 Anemia work up No results for input(s): VITAMINB12, FOLATE, FERRITIN, TIBC, IRON, RETICCTPCT in the last 72 hours. Urinalysis    Component Value Date/Time   COLORURINE YELLOW (A) 04/18/2021 1114   APPEARANCEUR HAZY (A) 04/18/2021 1114   LABSPEC 1.026 04/18/2021 1114   PHURINE 5.0 04/18/2021 1114   GLUCOSEU NEGATIVE 04/18/2021 1114   HGBUR NEGATIVE 04/18/2021 1114   BILIRUBINUR SMALL (A) 04/18/2021 1114   KETONESUR 20 (A) 04/18/2021 1114   PROTEINUR NEGATIVE 04/18/2021 1114   NITRITE NEGATIVE 04/18/2021 1114   LEUKOCYTESUR NEGATIVE 04/18/2021 1114   Sepsis Labs Invalid input(s): PROCALCITONIN,  WBC,  LACTICIDVEN Microbiology No results found for this or any previous visit (from the past 240 hour(s)).   Total time spend on discharging this patient, including the last patient exam, discussing the hospital stay, instructions for ongoing care as it relates to all pertinent caregivers, as well as preparing the  medical discharge records, prescriptions, and/or referrals as applicable, is 30 minutes.    Darlin Priestly, MD  [facility] 05/01/2021, 2:52 PM

## 2021-05-01 NOTE — TOC Progression Note (Signed)
Transition of Care [facility]) - Progression Note    Patient Details  Name: Michael Alvarado MRN: [id number] Date of Birth: Apr 06, 1956  [facility] CM/SW Contact  Chapman Fitch, RN Phone Number: 05/01/2021, 10:21 AM  Clinical Narrative:      Spoke with Joy at [facility].  Sister is to meet [place] at the [facility] care home to turn patient's check over.  Joy to follow up with me to let me know if patient can discharge today  Expected Discharge Plan: Skilled Nursing Facility Barriers to Discharge: Continued Medical Work up  Expected Discharge Plan and Services Expected Discharge Plan: Skilled Nursing Facility In-house Referral: Clinical Social Work   Post Acute Care Choice: Skilled Nursing Facility Living arrangements for the past 2 months: Single Family Home                                       Social Determinants of Health (SDOH) Interventions    Readmission Risk Interventions No flowsheet data found.

## 2021-05-18 ENCOUNTER — Emergency Department: Payer: Medicare Other

## 2021-05-18 DIAGNOSIS — M25551 Pain in right hip: Secondary | ICD-10-CM | POA: Diagnosis not present

## 2021-05-18 DIAGNOSIS — I10 Essential (primary) hypertension: Secondary | ICD-10-CM | POA: Diagnosis not present

## 2021-05-18 DIAGNOSIS — W06XXXA Fall from bed, initial encounter: Secondary | ICD-10-CM | POA: Insufficient documentation

## 2021-05-18 DIAGNOSIS — Z20822 Contact with and (suspected) exposure to covid-19: Secondary | ICD-10-CM | POA: Insufficient documentation

## 2021-05-18 DIAGNOSIS — Z79899 Other long term (current) drug therapy: Secondary | ICD-10-CM | POA: Diagnosis not present

## 2021-05-18 DIAGNOSIS — E119 Type 2 diabetes mellitus without complications: Secondary | ICD-10-CM | POA: Diagnosis not present

## 2021-05-18 DIAGNOSIS — Y92199 Unspecified place in other specified residential institution as the place of occurrence of the external cause: Secondary | ICD-10-CM | POA: Insufficient documentation

## 2021-05-18 DIAGNOSIS — F0390 Unspecified dementia without behavioral disturbance: Secondary | ICD-10-CM | POA: Insufficient documentation

## 2021-05-18 LAB — CBC WITH DIFFERENTIAL/PLATELET
Abs Immature Granulocytes: 0.03 10*3/uL (ref 0.00–0.07)
Basophils Absolute: 0 10*3/uL (ref 0.0–0.1)
Basophils Relative: 0 %
Eosinophils Absolute: 0.1 10*3/uL (ref 0.0–0.5)
Eosinophils Relative: 1 %
HCT: 41.9 % (ref 39.0–52.0)
Hemoglobin: 14.6 g/dL (ref 13.0–17.0)
Immature Granulocytes: 0 %
Lymphocytes Relative: 20 %
Lymphs Abs: 1.6 10*3/uL (ref 0.7–4.0)
MCH: 32.7 pg (ref 26.0–34.0)
MCHC: 34.8 g/dL (ref 30.0–36.0)
MCV: 93.9 fL (ref 80.0–100.0)
Monocytes Absolute: 1 10*3/uL (ref 0.1–1.0)
Monocytes Relative: 12 %
Neutro Abs: 5.2 10*3/uL (ref 1.7–7.7)
Neutrophils Relative %: 67 %
Platelets: 235 10*3/uL (ref 150–400)
RBC: 4.46 MIL/uL (ref 4.22–5.81)
RDW: 14.3 % (ref 11.5–15.5)
WBC: 7.9 10*3/uL (ref 4.0–10.5)
nRBC: 0 % (ref 0.0–0.2)

## 2021-05-18 LAB — COMPREHENSIVE METABOLIC PANEL
ALT: 22 U/L (ref 0–44)
AST: 19 U/L (ref 15–41)
Albumin: 3.5 g/dL (ref 3.5–5.0)
Alkaline Phosphatase: 80 U/L (ref 38–126)
Anion gap: 6 (ref 5–15)
BUN: 13 mg/dL (ref 8–23)
CO2: 25 mmol/L (ref 22–32)
Calcium: 9.8 mg/dL (ref 8.9–10.3)
Chloride: 105 mmol/L (ref 98–111)
Creatinine, Ser: 0.72 mg/dL (ref 0.61–1.24)
GFR, Estimated: >60 mL/min (ref >60)
Glucose, Bld: 141 mg/dL — ABNORMAL HIGH (ref 70–99)
Potassium: 3.3 mmol/L — ABNORMAL LOW (ref 3.5–5.1)
Sodium: 136 mmol/L (ref 135–145)
Total Bilirubin: 0.8 mg/dL (ref 0.3–1.2)
Total Protein: 6.8 g/dL (ref 6.5–8.1)

## 2021-05-18 NOTE — ED Notes (Signed)
Spoke to someone at group home, she states she found him on the floor with head under a table after hearing a loud thud in the next room over. Eliquis is listed on MAR, last dose today. Found him on R hip, was able to stand initially

## 2021-05-18 NOTE — ED Triage Notes (Signed)
FIRST NURSE NOTE:  Pt arrived via [facility], pt fell out of bed 1 hour ago, c/o R hip pain, pt on stretcher, no deformity noted, some outward rotation, pt with decreased ROM.   VSS 142/70 p-70 97% 97.3 temp, alert to baseline.  Pt is from a facility but it is unknown the name.

## 2021-05-18 NOTE — ED Notes (Signed)
PT in XR 

## 2021-05-18 NOTE — ED Triage Notes (Signed)
See first nurse note.   This RN was able to speak to pt's sister, Misty Stanley. She states he lives in a group home in [place], contact [phone]. She states pt with dementia baseline. A&ox2, denies any pain, cannot remember fall. Able to move R foot during triage

## 2021-05-19 ENCOUNTER — Encounter: Payer: Self-pay | Admitting: Emergency Medicine

## 2021-05-19 ENCOUNTER — Emergency Department
Admission: EM | Admit: 2021-05-19 | Discharge: 2021-05-20 | Disposition: A | Discharge status: Home / Self Care | Payer: Medicare Other | Attending: Emergency Medicine | Admitting: Emergency Medicine

## 2021-05-19 ENCOUNTER — Other Ambulatory Visit: Payer: Self-pay

## 2021-05-19 ENCOUNTER — Emergency Department: Payer: Medicare Other

## 2021-05-19 DIAGNOSIS — M25551 Pain in right hip: Secondary | ICD-10-CM

## 2021-05-19 DIAGNOSIS — W19XXXA Unspecified fall, initial encounter: Secondary | ICD-10-CM

## 2021-05-19 LAB — URINALYSIS, COMPLETE (UACMP) WITH MICROSCOPIC
Bilirubin Urine: NEGATIVE
Glucose, UA: NEGATIVE mg/dL
Hgb urine dipstick: NEGATIVE
Ketones, ur: NEGATIVE mg/dL
Leukocytes,Ua: NEGATIVE
Nitrite: NEGATIVE
Protein, ur: NEGATIVE mg/dL
Specific Gravity, Urine: 1.025 (ref 1.005–1.030)
Squamous Epithelial / HPF: NONE SEEN (ref 0–5)
pH: 5 (ref 5.0–8.0)

## 2021-05-19 LAB — RESP PANEL BY RT-PCR (FLU A&B, COVID) ARPGX2
Influenza A by PCR: NEGATIVE
Influenza B by PCR: NEGATIVE
SARS Coronavirus 2 by RT PCR: NEGATIVE

## 2021-05-19 LAB — TROPONIN I (HIGH SENSITIVITY): Troponin I (High Sensitivity): 8 ng/L (ref <18)

## 2021-05-19 MED ORDER — OXYCODONE-ACETAMINOPHEN 5-325 MG PO TABS
1.0000 | ORAL_TABLET | Freq: Once | ORAL | Status: AC
  Administered 2021-05-19: 04:00:00 1 via ORAL
  Filled 2021-05-19: qty 1

## 2021-05-19 MED ORDER — APIXABAN 5 MG PO TABS
5.0000 mg | ORAL_TABLET | Freq: Two times a day (BID) | ORAL | Status: DC
  Administered 2021-05-19 – 2021-05-20 (×3): 5 mg via ORAL
  Filled 2021-05-19 (×3): qty 1

## 2021-05-19 MED ORDER — OXYCODONE-ACETAMINOPHEN 5-325 MG PO TABS
1.0000 | ORAL_TABLET | Freq: Four times a day (QID) | ORAL | Status: DC | PRN
  Administered 2021-05-19 – 2021-05-20 (×2): 1 via ORAL
  Filled 2021-05-19 (×2): qty 1

## 2021-05-19 MED ORDER — QUETIAPINE FUMARATE 300 MG PO TABS
300.0000 mg | ORAL_TABLET | Freq: Every day | ORAL | Status: DC
  Administered 2021-05-19: 22:00:00 300 mg via ORAL
  Filled 2021-05-19: qty 1

## 2021-05-19 MED ORDER — ATORVASTATIN CALCIUM 20 MG PO TABS
10.0000 mg | ORAL_TABLET | Freq: Every day | ORAL | Status: DC
  Administered 2021-05-20: 09:00:00 10 mg via ORAL
  Filled 2021-05-19: qty 1
  Filled 2021-05-19: qty 0.5

## 2021-05-19 MED ORDER — CARVEDILOL 25 MG PO TABS
25.0000 mg | ORAL_TABLET | Freq: Two times a day (BID) | ORAL | Status: DC
  Administered 2021-05-19 – 2021-05-20 (×3): 25 mg via ORAL
  Filled 2021-05-19 (×3): qty 1

## 2021-05-19 MED ORDER — AMLODIPINE BESYLATE 5 MG PO TABS
5.0000 mg | ORAL_TABLET | Freq: Every day | ORAL | Status: DC
  Administered 2021-05-19 – 2021-05-20 (×2): 5 mg via ORAL
  Filled 2021-05-19 (×2): qty 1

## 2021-05-19 MED ORDER — HYDROCHLOROTHIAZIDE 25 MG PO TABS
25.0000 mg | ORAL_TABLET | Freq: Every day | ORAL | Status: DC
  Administered 2021-05-19 – 2021-05-20 (×2): 25 mg via ORAL
  Filled 2021-05-19 (×2): qty 1

## 2021-05-19 NOTE — ED Notes (Signed)
Pt resting. Chest expansion symmetrical and equal chest rise and fall.

## 2021-05-19 NOTE — ED Notes (Addendum)
Pt was noted again to be OOB and ambulating with no DME or unsteady gait when this RN watched pt. Pt does not appear to be in pain or distress, no grimacing or guarding noted on ambulation. Pt was able to urinate in a urinal, MD and PT aware.    This RN spoke to group home and spoke with Harriett Sine and made her aware of changes in pt's ambulation and Harriett Sine states they are willing to take pt back after cleared with admin who also stated they will take pt back and provide transportation, MD suggested PT come back to eval pt. but appears they have left for the day, MD aware.

## 2021-05-19 NOTE — ED Provider Notes (Signed)
Patient continues to endorse pain after fall.  Will provide additional Percocet, as needed's written for well patient anticipated to continue in the ED   Sharyn Creamer, MD 05/19/21 1443

## 2021-05-19 NOTE — ED Notes (Signed)
Pt is able to use urinal and can ambulate with assistance using a walker.

## 2021-05-19 NOTE — ED Notes (Signed)
Pt was found to be ambulating in the hallway with no issues noted. Pt was also found to have dressed himself with no instructions and had properly done so.  Pt was instructed to get back into bed for safety reasons. Pt was able to maintain a steady gait and maintain a stand by assist.

## 2021-05-19 NOTE — ED Notes (Signed)
Tur[place] sandwich tray given to pt 

## 2021-05-19 NOTE — ED Notes (Signed)
Pt's brief and grown was changed.

## 2021-05-19 NOTE — ED Notes (Signed)
Pt cleansed of urine incontinence, pt able to stand with assistance, provided clean, dry, brief. Condom cath applied to patient at this time.

## 2021-05-19 NOTE — ED Notes (Signed)
Harriett Sine (caregiver) updated on pt status and plan of care.

## 2021-05-19 NOTE — ED Notes (Signed)
Michael Alvarado who is admin for pt's group home came to see pt and discuss plan of care for pt and per conversation with Dr. Roxan Hockey and Michael Alvarado who have decided its best pt does rehab before determining if pt can return back to group home safely.

## 2021-05-19 NOTE — ED Provider Notes (Signed)
Patient up ambulating with a walker which he uses at baseline no unsteadiness or discomfort.  Facility is coming to see patient back at baseline I am not sure that he requires skilled placement at least not from the ER his he is at his normal functional status.   Willy Eddy, MD 05/19/21 [phone]

## 2021-05-19 NOTE — Evaluation (Signed)
Physical Therapy Evaluation Patient Details Name: Michael Alvarado MRN: [id number] DOB: 28-Aug-1955 Today's Date: 05/19/2021  History of Present Illness  Pt is a 65 y/o M who presented to the ED on 05/18/21 with c/c of fall & hip pain. Pt lives in a group home & had an unwitnessed fall & pt unable to remember fall. No acute fx was seen on imaging. PMH: dementia, DM, HTN, PE on eliquis, chronic back pain, HLD  Clinical Impression  Pt seen for PT evaluation with pt demonstrating cognitive deficits but with hx of dementia. Pt reporting need to void with PT assisting pt with task (total assist for urinal use but continent void). Pt c/o back pain (but notes it's chronic x 3 years) that hinders mobility. Pt requires mod<>max assist for supine<>sit with extra time with BUE support. Pt is able to complete transfers with min assist & ambulate short distance in hallway with min assist<>CGA with RW but very poor use of AD & decreased ability to follow instructions for proper use. PT assists with clothing management throughout session. As pt is from group home & has to be relatively independent to return, at this time pt would benefit from STR upon d/c. Will continue to follow pt acutely to progress independence with bed mobility, transfers & gait.     Recommendations for follow up therapy are one component of a multi-disciplinary discharge planning process, led by the attending physician.  Recommendations may be updated based on patient status, additional functional criteria and insurance authorization.  Follow Up Recommendations Skilled nursing-short term rehab (<3 hours/day)    Assistance Recommended at Discharge Frequent or constant Supervision/Assistance  Functional Status Assessment Patient has had a recent decline in their functional status and demonstrates the ability to make significant improvements in function in a reasonable and predictable amount of time.  Equipment Recommendations  Rolling walker (2  wheels);BSC/3in1    Recommendations for Other Services       Precautions / Restrictions Precautions Precautions: Fall Restrictions Weight Bearing Restrictions: No      Mobility  Bed Mobility Overal bed mobility: Needs Assistance Bed Mobility: Rolling;Supine to Sit;Sit to Supine Rolling: Min assist (use of bed rails, tactile/verbal cuing to initiate)   Supine to sit: Max assist;HOB elevated (BUE support on PT/bed rails, extra time, assistance to upright trunk) Sit to supine: Mod assist;HOB elevated (assistance to elevate BLE onto bed)        Transfers Overall transfer level: Needs assistance Equipment used: Rolling walker (2 wheels) Transfers: Sit to/from Stand Sit to Stand: Min assist;From elevated surface                Ambulation/Gait Ambulation/Gait assistance: Min assist;Min guard Gait Distance (Feet): 35 Feet Assistive device: Rolling walker (2 wheels) Gait Pattern/deviations: Decreased step length - right;Decreased step length - left;Decreased stride length;Decreased dorsiflexion - right;Decreased dorsiflexion - left;Trunk flexed Gait velocity: decreased     General Gait Details: Poor awareness re: safe use of RW as pt pushes it out in front of him, ambulates with feet outside of base of AD.  Stairs            Wheelchair Mobility    Modified Rankin (Stroke Patients Only)       Balance Overall balance assessment: Needs assistance;History of Falls Sitting-balance support: Bilateral upper extremity supported;Feet supported Sitting balance-Leahy Scale: Fair Sitting balance - Comments: CGA static sitting   Standing balance support: Bilateral upper extremity supported;Reliant on assistive device for balance Standing balance-Leahy Scale: Poor Standing balance  comment: BUE support on RW                             Pertinent Vitals/Pain Pain Assessment: Faces Faces Pain Scale: Hurts whole lot Pain Location: chronic low back  pain Pain Descriptors / Indicators: Grimacing;Discomfort;Aching Pain Intervention(s): Limited activity within patient's tolerance;Monitored during session;Repositioned (nurse notified)    Home Living Family/patient expects to be discharged to:: Group home                        Prior Function Prior Level of Function : Independent/Modified Independent             Mobility Comments: Pt with hx of fall leading to admission, unsure if pt was ambulatory with AD or not.       Hand Dominance        Extremity/Trunk Assessment   Upper Extremity Assessment Upper Extremity Assessment: Generalized weakness    Lower Extremity Assessment Lower Extremity Assessment: Generalized weakness       Communication   Communication: No difficulties  Cognition Arousal/Alertness: Awake/alert Behavior During Therapy: WFL for tasks assessed/performed Overall Cognitive Status: History of cognitive impairments - at baseline                                 General Comments: Pt with hx of dementia, unsure of location but demonstrates some awareness as he confirms a fall prior to admission. Pt follows simple commands with extra time.        General Comments General comments (skin integrity, edema, etc.): PT assists pt with donning shorts with PT threading on BLE & pt able to slightly bridge & assist with pulling them over hips at beginning of session. At end of session PT assists with doffing brief & shorts, pt declines doffing soiled shirt, to allow nurse to assist him into clean gown. During session pt reports need to void & stands with RW while PT manages clothing & urinal, pt with continent void.    Exercises     Assessment/Plan    PT Assessment Patient needs continued PT services  PT Problem List Decreased strength;Decreased mobility;Decreased safety awareness;Decreased activity tolerance;Decreased balance;Decreased knowledge of use of DME;Decreased knowledge of  precautions;Cardiopulmonary status limiting activity;Decreased cognition;Pain;Decreased coordination       PT Treatment Interventions DME instruction;Therapeutic activities;Gait training;Therapeutic exercise;Patient/family education;Modalities;Stair training;Balance training;Functional mobility training;Manual techniques;Neuromuscular re-education    PT Goals (Current goals can be found in the Care Plan section)  Acute Rehab PT Goals Patient Stated Goal: decreased pain PT Goal Formulation: With patient Time For Goal Achievement: 06/02/21 Potential to Achieve Goals: Fair    Frequency Min 2X/week   Barriers to discharge Decreased caregiver support;Inaccessible home environment      Co-evaluation               AM-PAC PT "6 Clicks" Mobility  Outcome Measure Help needed turning from your back to your side while in a flat bed without using bedrails?: A Lot Help needed moving from lying on your back to sitting on the side of a flat bed without using bedrails?: Total Help needed moving to and from a bed to a chair (including a wheelchair)?: A Little Help needed standing up from a chair using your arms (e.g., wheelchair or bedside chair)?: A Little Help needed to walk in hospital room?: A Little Help needed climbing 3-5  steps with a railing? : A Lot 6 Click Score: 14    End of Session   Activity Tolerance: Patient limited by pain;Patient tolerated treatment well Patient left: in bed;with nursing/sitter in room Nurse Communication: Mobility status PT Visit Diagnosis: Unsteadiness on feet (R26.81);Muscle weakness (generalized) (M62.81);Difficulty in walking, not elsewhere classified (R26.2);Pain;History of falling (Z91.81) Pain - part of body:  (low back)    Time: 3785-8850 PT Time Calculation (min) (ACUTE ONLY): 22 min   Charges:   PT Evaluation $PT Eval Moderate Complexity: 1 Mod PT Treatments $Therapeutic Activity: 8-22 mins        Aleda Grana, PT, DPT 05/19/21,  10:02 AM   Sandi Mariscal 05/19/2021, 10:00 AM

## 2021-05-19 NOTE — ED Notes (Signed)
Lunch tray given to pt, pt assisted with condiments. Pt denies any further needs at this time

## 2021-05-19 NOTE — ED Notes (Signed)
Pt ambulated steady gait with walker. MD visualized it.

## 2021-05-19 NOTE — ED Notes (Signed)
Physical therapy at bedside

## 2021-05-19 NOTE — ED Provider Notes (Signed)
[facility] Emergency Department Provider Note  ____________________________________________  Time seen: Approximately 3:41 AM  I have reviewed the triage vital signs and the nursing notes.   HISTORY  Chief Complaint Fall and Hip Pain   HPI Michael Alvarado is a 65 y.o. male with a history of dementia, diabetes, hypertension, PE on Eliquis who presents after a unwitnessed fall.  Patient lives in a group home and cannot remember the fall.  Seems like someone at the group home heard a loud noise and found patient under the table after falling.  Patient fell onto his right side.  Patient is confused and he seems to be at his baseline.  Patient tells me "I am not here because of my fall, I am here because I got cast to be on a play in the hospital was part of the play."  He is complaining of severe pain in the right hip that is worse with any movement.  He denies headache, neck pain, back pain, chest pain, abdominal pain.  Past Medical History:  Diagnosis Date   Dementia (HCC)    Diabetes mellitus without complication (HCC)    Hypertension     Patient Active Problem List   Diagnosis Date Noted   Pulmonary embolus (HCC) 04/24/2021   AKI (acute kidney injury) (HCC) 04/18/2021   HTN (hypertension) 04/18/2021   Homeless 04/18/2021   Fall at home, initial encounter 04/18/2021   Elevated troponin 04/18/2021   Leukocytosis 04/18/2021   Hypercalcemia 04/18/2021   Chronic back pain 04/18/2021   SIRS (systemic inflammatory response syndrome) (HCC) 04/18/2021   Depression 04/18/2021   Acute pulmonary embolism (HCC) 04/18/2021   AMS (altered mental status) 04/29/2020   Dementia (HCC)    Diabetes mellitus without complication (HCC)    Hypertensive urgency     Past Surgical History:  Procedure Laterality Date   BACK SURGERY      Prior to Admission medications   Medication Sig Start Date End Date Taking? Authorizing Provider  amLODipine (NORVASC) 5 MG tablet  Take 5 mg by mouth daily. 03/01/21   [provider]  apixaban (ELIQUIS) 5 MG TABS tablet Take 1 tablet (5 mg total) by mouth 2 (two) times daily. 05/01/21   Darlin Priestly, MD  atorvastatin (LIPITOR) 10 MG tablet Take 10 mg by mouth daily. 03/01/21   [provider]  butalbital-acetaminophen-caffeine (FIORICET) 50-325-40 MG tablet Take 1 tablet by mouth 2 (two) times daily as needed for headache. 04/30/20   Darlin Priestly, MD  carvedilol (COREG) 25 MG tablet Take 25 mg by mouth 2 (two) times daily. 03/17/21   [provider]  gabapentin (NEURONTIN) 300 MG capsule Take 300-600 mg by mouth in the morning, at noon, and at bedtime. 02/23/21   [provider]  hydrochlorothiazide (HYDRODIURIL) 25 MG tablet Take 25 mg by mouth daily. 04/07/21   [provider]  methocarbamol (ROBAXIN) 750 MG tablet Take 1 tablet (750 mg total) by mouth every 6 (six) hours as needed for muscle spasms. 05/01/21   Darlin Priestly, MD  QUEtiapine (SEROQUEL) 300 MG tablet Take 300 mg by mouth at bedtime.    [provider]    Allergies Patient has no known allergies.  Family History  Problem Relation Age of Onset   Other Mother    Hypertension Mother     Social History Social History   Tobacco Use   Smoking status: Never   Smokeless tobacco: Never  Substance Use Topics   Alcohol use: Yes  Comment: occasional   Drug use: Not Currently    Review of Systems  Constitutional: Negative for fever. Eyes: Negative for visual changes. ENT: Negative for facial injury or neck injury Cardiovascular: Negative for chest injury. Respiratory: Negative for shortness of breath. Negative for chest wall injury. Gastrointestinal: Negative for abdominal pain or injury. Genitourinary: Negative for dysuria. Musculoskeletal: Negative for back injury, + R hip pain Skin: Negative for laceration/abrasions. Neurological: Negative for head  injury.   ____________________________________________   PHYSICAL EXAM:  VITAL SIGNS: ED Triage Vitals [05/18/21 2256]  Enc Vitals Group     BP 136/85     Pulse Rate 69     Resp 20     Temp 98.9 F (37.2 C)     Temp Source Oral     SpO2 96 %     Weight      Height      Head Circumference      Peak Flow      Pain Score      Pain Loc      Pain Edu?      Excl. in GC?     Full spinal precautions maintained throughout the trauma exam. Constitutional: Alert and oriented x2 . No acute distress. Does not appear intoxicated. HEENT Head: Normocephalic and atraumatic. Face: No facial bony tenderness. Stable midface Ears: No hemotympanum bilaterally. No Battle sign Eyes: No eye injury. PERRL. No raccoon eyes Nose: Nontender. No epistaxis. No rhinorrhea Mouth/Throat: Mucous membranes are moist. No oropharyngeal blood. No dental injury. Airway patent without stridor. Normal voice. Neck: no C-collar. No midline c-spine tenderness.  Cardiovascular: Normal rate, regular rhythm. Normal and symmetric distal pulses are present in all extremities. Pulmonary/Chest: Chest wall is stable and nontender to palpation/compression. Normal respiratory effort. Breath sounds are normal. No crepitus.  Abdominal: Soft, nontender, non distended. Musculoskeletal: Tenderness to palpation of the R hip with limited ROM due to pain. Nontender with normal full range of motion in all other extremities. No deformities. No thoracic or lumbar midline spinal tenderness. Pelvis is stable. Skin: Skin is warm, dry and intact. No abrasions or contutions. Psychiatric: Speech and behavior are appropriate. Neurological: Normal speech and language. Moves all extremities to command. No gross focal neurologic deficits are appreciated.  Glascow Coma Score: 4 - Opens eyes on own 6 - Follows simple motor commands 4 - Seems confused, disoriented GCS: 14   ____________________________________________   LABS (all labs  ordered are listed, but only abnormal results are displayed)  Labs Reviewed  COMPREHENSIVE METABOLIC PANEL - Abnormal; Notable for the following components:      Result Value   Potassium 3.3 (*)    Glucose, Bld 141 (*)    All other components within normal limits  URINALYSIS, COMPLETE (UACMP) WITH MICROSCOPIC - Abnormal; Notable for the following components:   Color, Urine YELLOW (*)    APPearance HAZY (*)    Bacteria, UA RARE (*)    All other components within normal limits  RESP PANEL BY RT-PCR (FLU A&B, COVID) ARPGX2  CBC WITH DIFFERENTIAL/PLATELET  TROPONIN I (HIGH SENSITIVITY)   ____________________________________________  EKG  ED ECG REPORT I, Nita Sickle, the attending physician, personally viewed and interpreted this ECG.  Sinus rhythm with rate of 64, normal intervals, no ST elevations or depressions ____________________________________________  RADIOLOGY  I have personally reviewed the images performed during this visit and I agree with the Radiologist's read.   Interpretation by Radiologist:  CT Head Wo Contrast  Result Date: 05/19/2021 CLINICAL DATA:  Status post fall. EXAM: CT HEAD WITHOUT CONTRAST TECHNIQUE: Contiguous axial images were obtained from the base of the skull through the vertex without intravenous contrast. COMPARISON:  April 29, 2020 FINDINGS: Brain: There is mild cerebral atrophy with widening of the extra-axial spaces and ventricular dilatation. There are areas of decreased attenuation within the white matter tracts of the supratentorial brain, consistent with microvascular disease changes. There is stable right posterior parietal ventriculostomy catheter positioning, with its distal tip again seen adjacent to the lateral aspect of the body of the left lateral ventricle. Vascular: No hyperdense vessel or unexpected calcification. Skull: Normal. Negative for fracture or focal lesion. Sinuses/Orbits: No acute finding. Other: None. IMPRESSION:  1. Stable right posterior parietal ventriculostomy catheter positioning. 2. No acute intracranial abnormality. Electronically Signed   By: Aram Candela M.D.   On: 05/19/2021 00:18   CT Cervical Spine Wo Contrast  Result Date: 05/19/2021 CLINICAL DATA:  Status post fall. EXAM: CT CERVICAL SPINE WITHOUT CONTRAST TECHNIQUE: Multidetector CT imaging of the cervical spine was performed without intravenous contrast. Multiplanar CT image reconstructions were also generated. COMPARISON:  None. FINDINGS: Alignment: Normal. Skull base and vertebrae: No acute fracture. No primary bone lesion or focal pathologic process. Soft tissues and spinal canal: No prevertebral fluid or swelling. No visible canal hematoma. Disc levels: Moderate to marked severity endplate sclerosis is seen at the levels of C3-C4, C4-C5 and C5-C6. There is marked severity narrowing of the anterior atlantoaxial articulation. Marked severity intervertebral disc space narrowing is seen at C3-C4, C4-C5 and C5-C6. Bilateral moderate to marked severity multilevel facet joint hypertrophy is noted. Upper chest: Negative. Other: None. IMPRESSION: 1. Marked severity multilevel degenerative changes, most prominent at the levels of C3-C4, C4-C5 and C5-C6. 2. No evidence of an acute fracture or subluxation. Electronically Signed   By: Aram Candela M.D.   On: 05/19/2021 00:21   CT PELVIS WO CONTRAST  Result Date: 05/19/2021 CLINICAL DATA:  Fall from bed 1 hour ago with right hip EXAM: CT PELVIS WITHOUT CONTRAST TECHNIQUE: Multidetector CT imaging of the pelvis was performed following the standard protocol without intravenous contrast. COMPARISON:  Radiography from earlier the same day FINDINGS: L4-5 PLIF with L5 pedicle screw loosening and no visible bridging bone. Disc narrowing with endplate and facet spurring at L5-S1 causing bilateral foraminal narrowing. No acute fracture involving the pelvis or proximal femora. Located hip and sacroiliac  joints. No acute soft tissue finding. IMPRESSION: *No acute finding. 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 Electronically Signed   By: Tiburcio Pea M.D.   On: 05/19/2021 04:18   DG Hip Unilat With Pelvis 2-3 Views Left  Result Date: 05/18/2021 CLINICAL DATA:  Recent fall with left hip pain, initial encounter EXAM: DG HIP (WITH OR WITHOUT PELVIS) 3V LEFT COMPARISON:  None. FINDINGS: Pelvic ring is intact. Postsurgical changes in the lower lumbar spine are noted. No acute fracture or dislocation is seen. No soft tissue abnormality is noted. IMPRESSION: No acute abnormality noted. Electronically Signed   By: Alcide Clever M.D.   On: 05/18/2021 23:49   DG Hip Unilat  With Pelvis 2-3 Views Right  Result Date: 05/18/2021 CLINICAL DATA:  Right hip pain following fall, initial encounter EXAM: DG HIP (WITH OR WITHOUT PELVIS) 3V RIGHT COMPARISON:  None. FINDINGS: Pelvic ring is intact. No acute fracture or dislocation is seen. No soft tissue abnormality is noted. IMPRESSION: No acute fracture is seen. Electronically Signed   By: Alcide Clever M.D.   On: 05/18/2021 23:50  ____________________________________________   PROCEDURES  Procedure(s) performed: None Procedures Critical Care performed:  None ____________________________________________   INITIAL IMPRESSION / ASSESSMENT AND PLAN / ED COURSE  65 y.o. male with a history of dementia, diabetes, hypertension, PE on Eliquis who presents after a unwitnessed fall.  Patient is confused however this seems to be his baseline with a history of dementia.  Does not remember the fall.  Was found on his right side on the floor.  His only complaint is right hip pain.  There is no signs of trauma on remaining of his exam.  Imaging including head CT, CT cervical spine, bilateral hip x-ray were all negative for any traumatic injury.  Patient with very limited range of motion of the hip due to pain therefore a CT pelvis was done.  The CT is negative for hip  fractures.  After Percocet patient still unable to ambulate due to severe pain.  Labs with no acute abnormalities, no signs of UTI, dehydration, AKI, and electrolyte derangements.  Patient will be placed in border status, PT and social work has been consulted.  Home meds have been ordered.  I reviewed patient's record including the discharge summary for his recent admission a month ago when patient presented after syncopal event and was found to have a new PE.  Is currently on Eliquis.  He denies any chest pain or shortness of breath, he is not tachypneic, tachycardic or hypoxic.       ____________________________________________  Please note:  Patient was evaluated in Emergency Department today for the symptoms described in the history of present illness. Patient was evaluated in the context of the global COVID-19 pandemic, which necessitated consideration that the patient might be at risk for infection with the SARS-CoV-2 virus that causes COVID-19. Institutional protocols and algorithms that pertain to the evaluation of patients at risk for COVID-19 are in a state of rapid change based on information released by regulatory bodies including the CDC and federal and state organizations. These policies and algorithms were followed during the patient's care in the ED.  Some ED evaluations and interventions may be delayed as a result of limited staffing during the pandemic.   ____________________________________________   FINAL CLINICAL IMPRESSION(S) / ED DIAGNOSES   Final diagnoses:  Fall, initial encounter  Right hip pain      NEW MEDICATIONS STARTED DURING THIS VISIT:  ED Discharge Orders     None        Note:  This document was prepared using Dragon voice recognition software and may include unintentional dictation errors.    Nita Sickle, MD 05/19/21 [phone]

## 2021-05-20 DIAGNOSIS — M25551 Pain in right hip: Secondary | ICD-10-CM | POA: Diagnosis not present

## 2021-05-20 MED ORDER — CYCLOBENZAPRINE HCL 10 MG PO TABS
5.0000 mg | ORAL_TABLET | Freq: Once | ORAL | Status: AC
  Administered 2021-05-20: 10:00:00 5 mg via ORAL
  Filled 2021-05-20: qty 1

## 2021-05-20 MED ORDER — OXYCODONE-ACETAMINOPHEN 5-325 MG PO TABS
1.0000 | ORAL_TABLET | ORAL | 0 refills | Status: AC | PRN
Start: 2021-05-20 — End: 2022-05-20

## 2021-05-20 NOTE — TOC Initial Note (Signed)
Transition of Care [facility]) - Initial/Assessment Note    Patient Details  Name: Michael Alvarado MRN: [id number] Date of Birth: 11-Nov-1955  Transition of Care [facility]) CM/SW Contact:    Allayne Butcher, RN Phone Number: 05/20/2021, 9:39 AM  Clinical Narrative:                 Patient brought into the emergency room after falling out of bed at this group home.  PT is recommending SNF and Doristine Mango the group home owner agrees that patient going to rehab would be best before returning to the home.  RNCM has started bed search.    Expected Discharge Plan: Skilled Nursing Facility Barriers to Discharge: SNF Pending bed offer   Patient Goals and CMS Choice Patient states their goals for this hospitalization and ongoing recovery are:: Needs rehab before returning to group home CMS Medicare.gov Compare Post Acute Care list provided to:: Patient Choice offered to / list presented to : Patient, Sibling  Expected Discharge Plan and Services Expected Discharge Plan: Skilled Nursing Facility   Discharge Planning Services: CM Consult Post Acute Care Choice: Skilled Nursing Facility Living arrangements for the past 2 months: Group Home                 DME Arranged: N/A DME Agency: NA       HH Arranged: NA HH Agency: NA        Prior Living Arrangements/Services Living arrangements for the past 2 months: Group Home Lives with:: Facility Resident Patient language and need for interpreter reviewed:: Yes Do you feel safe going back to the place where you live?: Yes      Need for Family Participation in Patient Care: Yes (Comment) Care giver support system in place?: Yes (comment)   Criminal Activity/Legal Involvement Pertinent to Current Situation/Hospitalization: No - Comment as needed  Activities of Daily Living      Permission Sought/Granted Permission sought to share information with : Case Manager, Family Supports, Magazine features editor Permission granted to share information  with : Yes, Verbal Permission Granted  Share Information with NAME: [name]  Permission granted to share info w AGENCY: SNF's  Permission granted to share info w Relationship: sister  Permission granted to share info w Contact Information: [phone]  Emotional Assessment       Orientation: : Oriented to Self, Oriented to Place Alcohol / Substance Use: Not Applicable Psych Involvement: No (comment)  Admission diagnosis:  Fall Patient Active Problem List   Diagnosis Date Noted   Pulmonary embolus (HCC) 04/24/2021   AKI (acute kidney injury) (HCC) 04/18/2021   HTN (hypertension) 04/18/2021   Homeless 04/18/2021   Fall at home, initial encounter 04/18/2021   Elevated troponin 04/18/2021   Leukocytosis 04/18/2021   Hypercalcemia 04/18/2021   Chronic back pain 04/18/2021   SIRS (systemic inflammatory response syndrome) (HCC) 04/18/2021   Depression 04/18/2021   Acute pulmonary embolism (HCC) 04/18/2021   AMS (altered mental status) 04/29/2020   Dementia (HCC)    Diabetes mellitus without complication (HCC)    Hypertensive urgency    PCP:  Danella Penton, MD Pharmacy:   [facility] [phone] Nicholes Rough [facility] - [address] [place] [address] Phone: [phone] Fax: [phone]     Social Determinants of Health (SDOH) Interventions    Readmission Risk Interventions No flowsheet data found.

## 2021-05-20 NOTE — ED Notes (Signed)
Pt eating breakfast tray. Co intermittent pain but none at this time. Pt awaiting TOC consult.

## 2021-05-20 NOTE — TOC Progression Note (Signed)
Transition of Care [facility]) - Progression Note    Patient Details  Name: Michael Alvarado MRN: [id number] Date of Birth: 1956-04-01  [facility] Care [facility]) CM/SW Contact  Allayne Butcher, RN Phone Number: 05/20/2021, 10:36 AM  Clinical Narrative:    Patient's sister, Bonita Quin is okay with SNF rehab at [facility].  RNCM started insurance authorization.     Expected Discharge Plan: Skilled Nursing Facility Barriers to Discharge: SNF Pending bed offer  Expected Discharge Plan and Services Expected Discharge Plan: Skilled Nursing Facility   Discharge Planning Services: CM Consult Post Acute Care Choice: Skilled Nursing Facility Living arrangements for the past 2 months: Group Home                 DME Arranged: N/A DME Agency: NA       HH Arranged: NA HH Agency: NA         Social Determinants of Health (SDOH) Interventions    Readmission Risk Interventions No flowsheet data found.

## 2021-05-20 NOTE — NC FL2 (Signed)
[place] MEDICAID FL2 LEVEL OF CARE SCREENING TOOL     IDENTIFICATION  Patient Name: Michael Alvarado Birthdate: 09/11/1955 Sex: male Admission Date (Current Location): 05/19/2021  [place] and [place] Number:  Michael Alvarado [id number] [facility] Facility and Address:  [facility], [address] [place], [place] [address]      Provider Number: [id number]  Attending Physician Name and Address:  No att. providers found  Relative Name and Phone Number:  Michael Alvarado (Sister)   [phone] [facility])    Current Level of Care: Hospital Recommended Level of Care: Skilled Nursing Facility Prior Approval Number:    Date Approved/Denied:   PASRR Number: [id number]  Discharge Plan: SNF    Current Diagnoses: Patient Active Problem List   Diagnosis Date Noted   Pulmonary embolus (HCC) 04/24/2021   AKI (acute kidney injury) (HCC) 04/18/2021   HTN (hypertension) 04/18/2021   Homeless 04/18/2021   Fall at home, initial encounter 04/18/2021   Elevated troponin 04/18/2021   Leukocytosis 04/18/2021   Hypercalcemia 04/18/2021   Chronic back pain 04/18/2021   SIRS (systemic inflammatory response syndrome) (HCC) 04/18/2021   Depression 04/18/2021   Acute pulmonary embolism (HCC) 04/18/2021   AMS (altered mental status) 04/29/2020   Dementia (HCC)    Diabetes mellitus without complication (HCC)    Hypertensive urgency     Orientation RESPIRATION BLADDER Height & Weight     Self, Place  Normal Incontinent Weight:   Height:     BEHAVIORAL SYMPTOMS/MOOD NEUROLOGICAL BOWEL NUTRITION STATUS  Wanderer   Continent Diet (regular diet)  AMBULATORY STATUS COMMUNICATION OF NEEDS Skin   Limited Assist Verbally Normal                       Personal Care Assistance Level of Assistance  Bathing, Feeding, Dressing Bathing Assistance: Limited assistance Feeding assistance: Limited assistance Dressing Assistance: Limited assistance Total Care Assistance: Limited assistance    Functional Limitations Info  Sight, Hearing, Speech Sight Info: Adequate Hearing Info: Adequate Speech Info: Adequate    SPECIAL CARE FACTORS FREQUENCY  PT (By licensed PT), OT (By licensed OT)     PT Frequency: 5 times per week OT Frequency: 2-3 times per week            Contractures Contractures Info: Not present    Additional Factors Info  Code Status, Allergies Code Status Info: Full Allergies Info: NKA           Current Medications (05/20/2021):  This is the current hospital active medication list Current Facility-Administered Medications  Medication Dose Route Frequency Provider Last Rate Last Admin   amLODipine (NORVASC) tablet 5 mg  5 mg Oral Daily Don Perking, [place], MD   5 mg at 05/20/21 2353   apixaban (ELIQUIS) tablet 5 mg  5 mg Oral BID Don Perking, [place], MD   5 mg at 05/20/21 6144   atorvastatin (LIPITOR) tablet 10 mg  10 mg Oral Daily Don Perking, [place], MD   10 mg at 05/20/21 3154   carvedilol (COREG) tablet 25 mg  25 mg Oral BID Don Perking, [place], MD   25 mg at 05/20/21 0086   cyclobenzaprine (FLEXERIL) tablet 5 mg  5 mg Oral Once Chesley Noon, MD       hydrochlorothiazide (HYDRODIURIL) tablet 25 mg  25 mg Oral Daily Don Perking, [place], MD   25 mg at 05/20/21 7619   oxyCODONE-acetaminophen (PERCOCET/ROXICET) 5-325 MG per tablet 1 tablet  1 tablet Oral Q6H PRN Sharyn Creamer, MD   1 tablet at 05/20/21  6314   QUEtiapine (SEROQUEL) tablet 300 mg  300 mg Oral QHS Don Perking, [place], MD   300 mg at 05/19/21 2134   Current Outpatient Medications  Medication Sig Dispense Refill   acetaminophen-codeine (TYLENOL #3) 300-30 MG tablet Take 2 tablets by mouth every 6 (six) hours as needed.     amLODipine (NORVASC) 5 MG tablet Take 5 mg by mouth daily.     apixaban (ELIQUIS) 5 MG TABS tablet Take 1 tablet (5 mg total) by mouth 2 (two) times daily. 60 tablet    atorvastatin (LIPITOR) 10 MG tablet Take 10 mg by mouth daily.     butalbital-acetaminophen-caffeine  (FIORICET) 50-325-40 MG tablet Take 1 tablet by mouth 2 (two) times daily as needed for headache. 14 tablet 0   carvedilol (COREG) 25 MG tablet Take 25 mg by mouth 2 (two) times daily.     gabapentin (NEURONTIN) 300 MG capsule Take 300-600 mg by mouth in the morning, at noon, and at bedtime.     hydrochlorothiazide (HYDRODIURIL) 25 MG tablet Take 25 mg by mouth daily.     HYDROcodone-acetaminophen (NORCO/VICODIN) 5-325 MG tablet Take 1 tablet by mouth 2 (two) times daily as needed.     methocarbamol (ROBAXIN) 750 MG tablet Take 1 tablet (750 mg total) by mouth every 6 (six) hours as needed for muscle spasms.     QUEtiapine (SEROQUEL) 300 MG tablet Take 300 mg by mouth at bedtime.       Discharge Medications: Please see discharge summary for a list of discharge medications.  Relevant Imaging Results:  Relevant Lab Results:   Additional Information SS# [id number]  Allayne Butcher, RN

## 2021-05-20 NOTE — ED Notes (Signed)
Report received from Dorian RN. Patient care assumed. Patient/RN introduction complete. Will continue to monitor.  °

## 2021-05-20 NOTE — ED Provider Notes (Signed)
----------------------------------------- °  5:17 AM on 05/20/2021 -----------------------------------------   Blood pressure 123/89, pulse 80, temperature 98.7 F (37.1 C), temperature source Oral, resp. rate 17, SpO2 96 %.  The patient is calm and cooperative at this time.  There have been no acute events since the last update.  Awaiting disposition plan from social worker.   Irean Hong, MD 05/20/21 [phone]

## 2021-05-20 NOTE — TOC Transition Note (Incomplete)
Transition of Care [facility]) - CM/SW Discharge Note   Patient Details  Name: Michael Alvarado MRN: [id number] Date of Birth: April 25, 1956  Transition of Care [facility]) CM/SW Contact:  Allayne Butcher, RN Phone Number: 05/20/2021, 11:56 AM   Clinical Narrative:       Final next level of care: Skilled Nursing Facility Barriers to Discharge: Barriers Resolved   Patient Goals and CMS Choice Patient states their goals for this hospitalization and ongoing recovery are:: sister good with patient going to [place] for rehab CMS Medicare.gov Compare Post Acute Care list provided to:: Patient Represenative (must comment) Choice offered to / list presented to : Sibling  Discharge Placement PASRR number recieved: 05/20/21            Patient chooses bed at: [facility] Patient to be transferred to facility by: [facility] Name of family member notified: [name] Patient and family notified of of transfer: 05/20/21  Discharge Plan and Services   Discharge Planning Services: CM Consult Post Acute Care Choice: Skilled Nursing Facility          DME Arranged: N/A DME Agency: NA       HH Arranged: NA HH Agency: NA        Social Determinants of Health (SDOH) Interventions     Readmission Risk Interventions No flowsheet data found.

## 2022-08-21 IMAGING — CT CT PELVIS W/O CM
2 of 6 series · 16 of 46 positions shown, 18 images · non-contrast
Comparison: Radiography from earlier the same day

CLINICAL DATA: Fall from bed 1 hour ago with right hip

EXAM:
CT PELVIS WITHOUT CONTRAST
TECHNIQUE: Multidetector CT imaging of the pelvis was performed following the
standard protocol without intravenous contrast.

[Series 3: axial st · axial · 0.73mm/px · z∈[-1179,-951]mm · 13 of 132 slices shown, 15 images]
[im 9/132  soft-tissue]
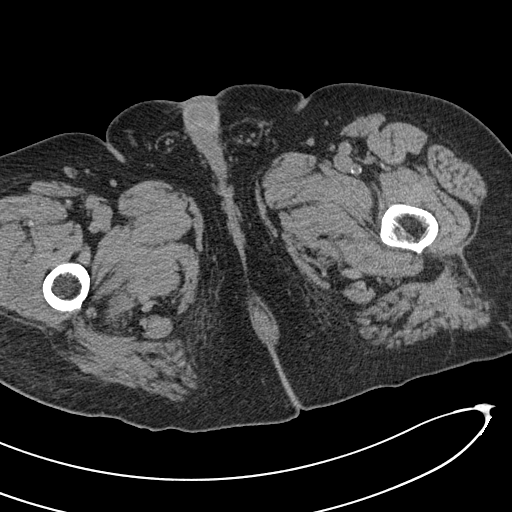
[im 9/132  bone]
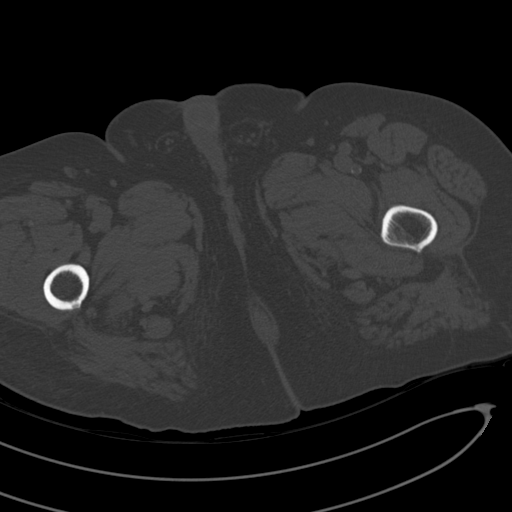
[im 18/132  soft-tissue]
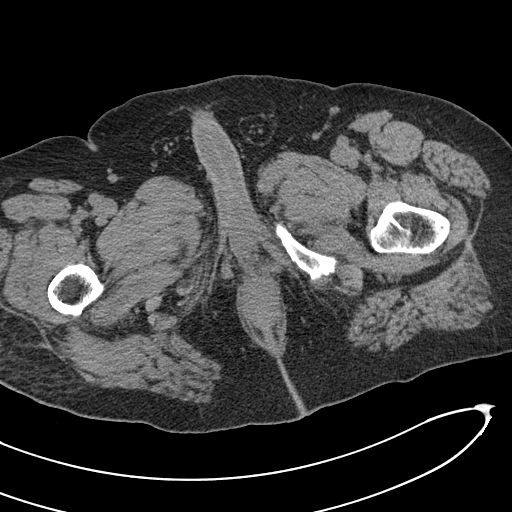
[im 27/132  soft-tissue]
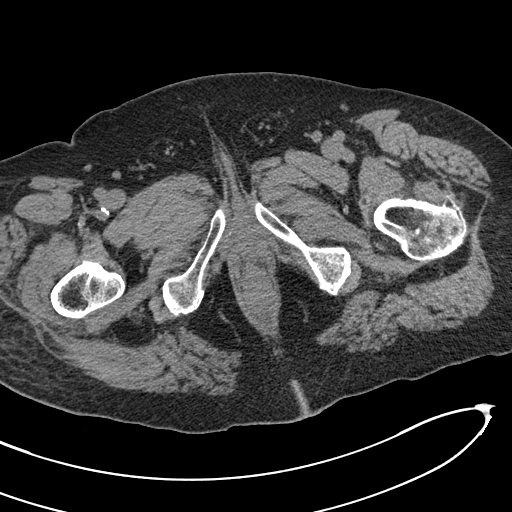
[im 35/132  soft-tissue]
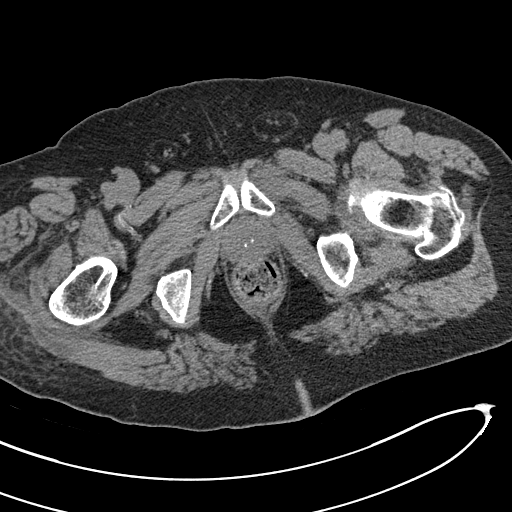
[im 44/132  soft-tissue]
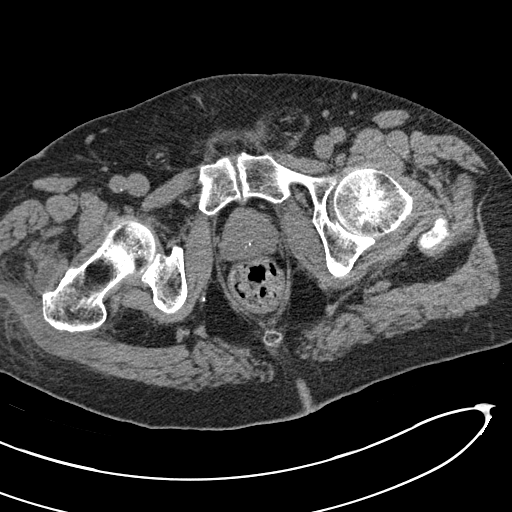
[im 53/132  soft-tissue]
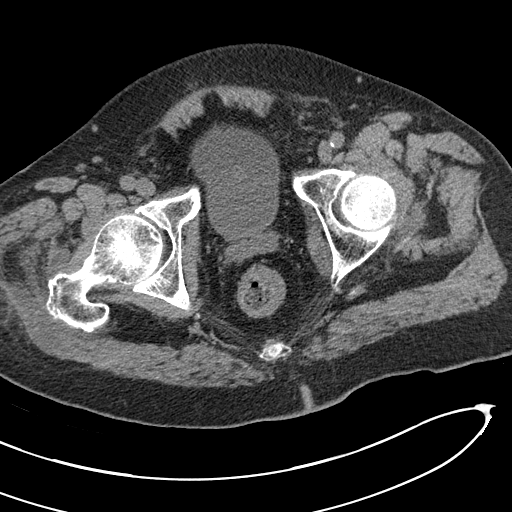
[im 70/132  soft-tissue]
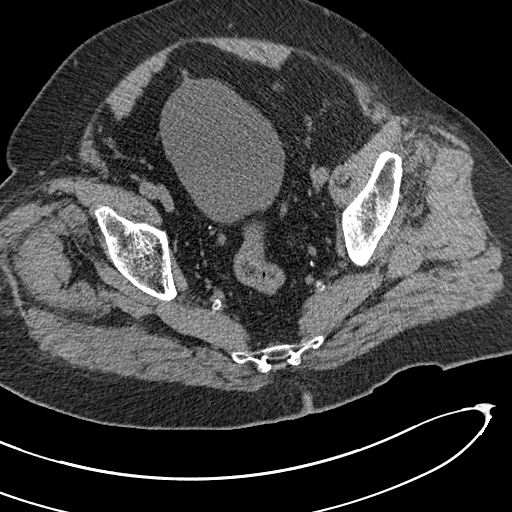
[im 79/132  soft-tissue]
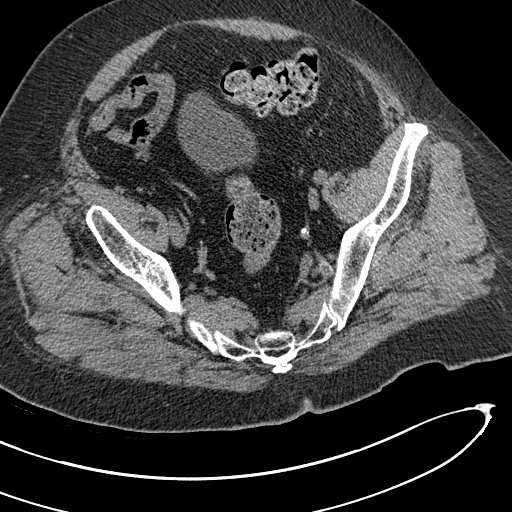
[im 88/132  soft-tissue]
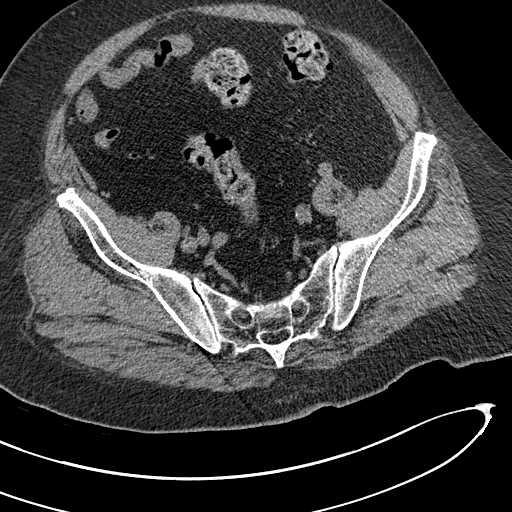
[im 88/132  bone]
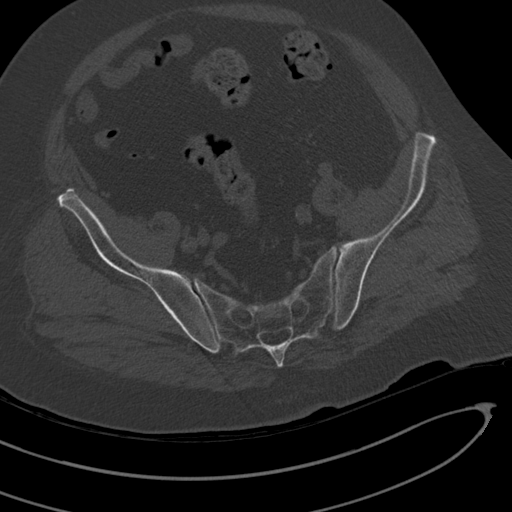
[im 97/132  soft-tissue]
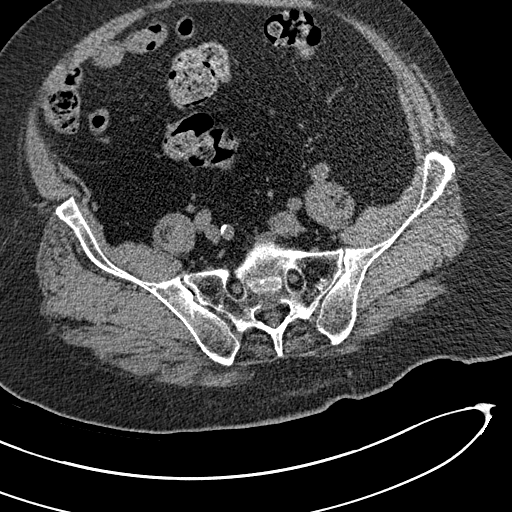
[im 105/132  soft-tissue]
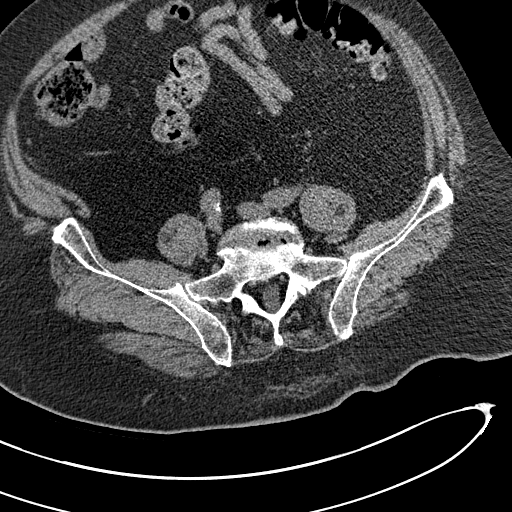
[im 114/132  soft-tissue]
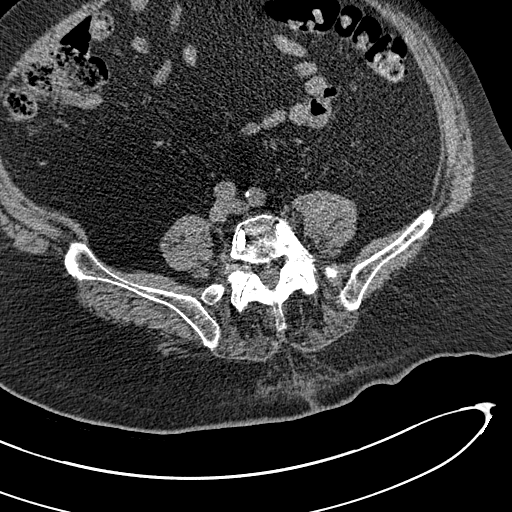
[im 123/132  soft-tissue]
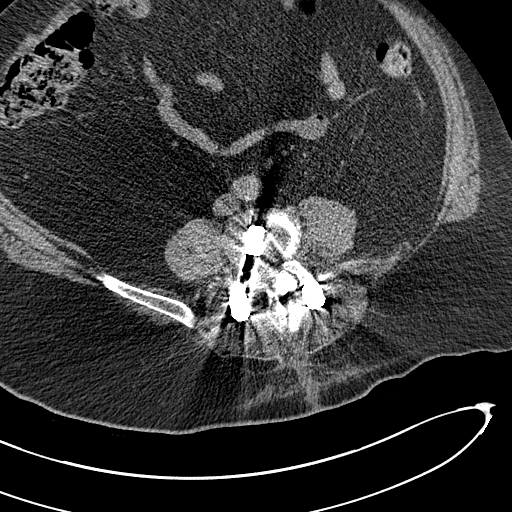

[Series 6: coronal st · coronal · 0.56mm/px · 3 of 191 slices shown]
[im 48/191  soft-tissue]
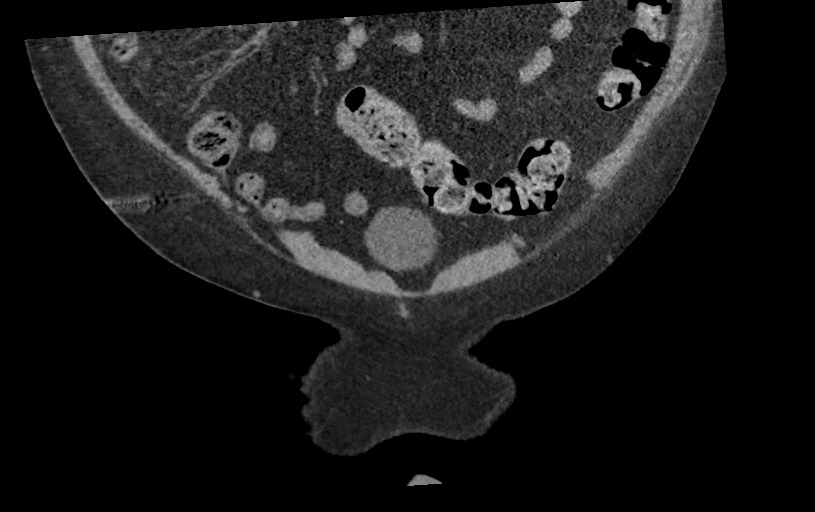
[im 96/191  soft-tissue]
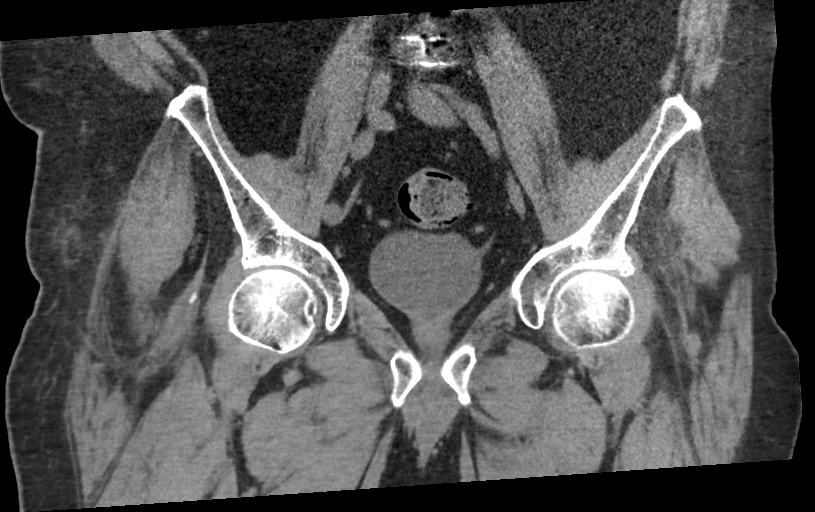
[im 143/191  soft-tissue]
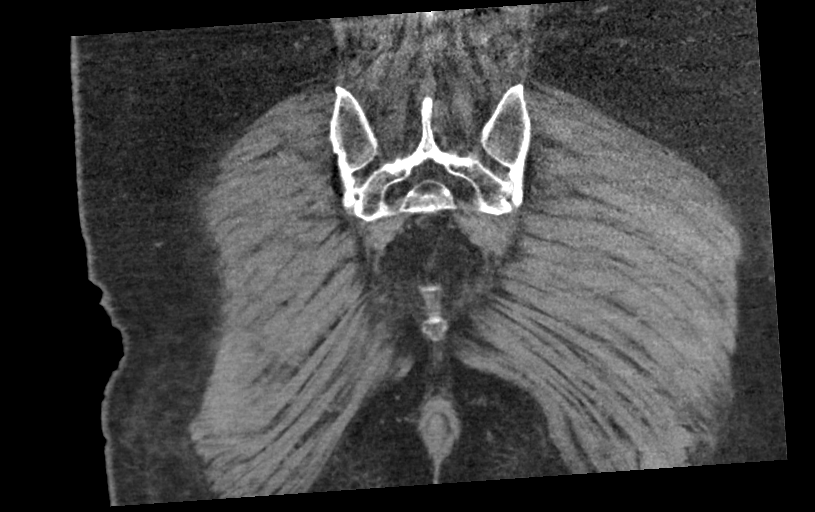

[16 of 46 positions shown; findings below may reference images not displayed]

FINDINGS: L4-5 PLIF with L5 pedicle screw loosening and no visible bridging
bone.

Disc narrowing with endplate and facet spurring at L5-S1 causing
bilateral foraminal narrowing.

No acute fracture involving the pelvis or proximal femora. Located
hip and sacroiliac joints.

No acute soft tissue finding.
IMPRESSION: *No acute finding.
*L4-5 PLIF with pseudoarthrosis findings.

## 2023-11-11 ENCOUNTER — Other Ambulatory Visit: Payer: Self-pay | Admitting: Orthopedic Surgery

## 2023-11-11 DIAGNOSIS — M4807 Spinal stenosis, lumbosacral region: Secondary | ICD-10-CM

## 2023-11-24 ENCOUNTER — Inpatient Hospital Stay: Admission: RE | Admit: 2023-11-24 | Source: Ambulatory Visit

## 2023-12-08 ENCOUNTER — Ambulatory Visit
Admission: RE | Admit: 2023-12-08 | Discharge: 2023-12-08 | Disposition: A | Discharge status: Home / Self Care | Source: Ambulatory Visit | Attending: Orthopedic Surgery | Admitting: Orthopedic Surgery

## 2023-12-08 DIAGNOSIS — M4807 Spinal stenosis, lumbosacral region: Secondary | ICD-10-CM
